# Patient Record
Sex: Female | Born: 1965 | Race: White | Hispanic: No | Marital: Married
Health system: Midwestern US, Academic
[De-identification: ages and names within clinical notes are randomized; demographics above are authoritative.]

---

## 2016-06-30 MED ORDER — LIDOCAINE (PF) 10 MG/ML (1 %) IJ SOLN
0 refills | Status: DC
Start: 2016-06-30 — End: 2016-06-30

## 2016-06-30 MED ORDER — LIDOCAINE (PF) 200 MG/10 ML (2 %) IJ SYRG
0 refills | Status: DC
Start: 2016-06-30 — End: 2016-06-30

## 2016-06-30 MED ORDER — ONDANSETRON HCL (PF) 4 MG/2 ML IJ SOLN
INTRAVENOUS | 0 refills | Status: DC
Start: 2016-06-30 — End: 2016-06-30

## 2016-06-30 MED ORDER — CEFAZOLIN 1 GRAM IJ SOLR
0 refills | Status: DC
Start: 2016-06-30 — End: 2016-06-30

## 2016-06-30 MED ORDER — PROPOFOL INJ 10 MG/ML IV VIAL
0 refills | Status: DC
Start: 2016-06-30 — End: 2016-06-30

## 2016-06-30 MED ORDER — FENTANYL CITRATE (PF) 50 MCG/ML IJ SOLN
0 refills | Status: DC
Start: 2016-06-30 — End: 2016-06-30

## 2016-06-30 MED ORDER — PHENYLEPHRINE IN 0.9% NACL(PF) 1 MG/10 ML (100 MCG/ML) IV SYRG
INTRAVENOUS | 0 refills | Status: DC
Start: 2016-06-30 — End: 2016-06-30

## 2016-06-30 MED ORDER — MIDAZOLAM 1 MG/ML IJ SOLN
INTRAVENOUS | 0 refills | Status: DC
Start: 2016-06-30 — End: 2016-06-30

## 2016-06-30 MED ORDER — ROPIVACAINE (PF) 5 MG/ML (0.5 %) IJ SOLN
0 refills | Status: DC
Start: 2016-06-30 — End: 2016-06-30

## 2016-07-08 MED ORDER — CEPHALEXIN 500 MG PO CAP
500 mg | ORAL_CAPSULE | Freq: Four times a day (QID) | ORAL | 1 refills | Status: DC
Start: 2016-07-08 — End: 2016-11-02

## 2016-10-28 ENCOUNTER — Encounter: Admit: 2016-10-28 | Discharge: 2016-10-28 | Payer: BC Managed Care – PPO

## 2016-10-28 ENCOUNTER — Ambulatory Visit: Admit: 2016-10-28 | Discharge: 2016-10-28 | Payer: BC Managed Care – PPO

## 2016-10-28 DIAGNOSIS — M1990 Unspecified osteoarthritis, unspecified site: Secondary | ICD-10-CM

## 2016-10-28 DIAGNOSIS — E785 Hyperlipidemia, unspecified: ICD-10-CM

## 2016-10-28 DIAGNOSIS — S91002D Unspecified open wound, left ankle, subsequent encounter: Principal | ICD-10-CM

## 2016-10-28 DIAGNOSIS — S91002S Unspecified open wound, left ankle, sequela: Principal | ICD-10-CM

## 2016-10-28 DIAGNOSIS — I10 Essential (primary) hypertension: ICD-10-CM

## 2016-10-28 DIAGNOSIS — F99 Mental disorder, not otherwise specified: ICD-10-CM

## 2016-10-28 DIAGNOSIS — E119 Type 2 diabetes mellitus without complications: ICD-10-CM

## 2016-10-28 DIAGNOSIS — E079 Disorder of thyroid, unspecified: ICD-10-CM

## 2016-10-28 DIAGNOSIS — K929 Disease of digestive system, unspecified: ICD-10-CM

## 2016-10-28 NOTE — Progress Notes
She is here today for reevaluation of her wound of her left ankle s/p peroneal repair and Brostrom.  She had a bit of postoperative hematoma that was expressed, but her wound was doing okay.  She has evidently been doing wound care locally.  I haven't even seen her back.  She still has this wound.  She talks about how they could see suture in the base of this.  She is not on any antibiotics.  We have taken a deep culture.  It probes down a fair bit.  I don't think there's any way in the world this thing is going to heal the way that it is.  Her wound needs to be washed out.  She will probably need to go into a VAC to let this thing granulate in.  Interestingly, though, the mechanical pain that she was having before and the severe instability have largely resolved.  I think that part is encouraging.  This needs to be done in the near future.      Surgery:  Left I&D and wound VAC placement        In the presence of Ree ShayGreg Sandrea Boer, MD,  I have taken down these notes, Danielle RankinStacy Grove, Safeco CorporationScribe. 10/28/2016 1:10 PM

## 2016-10-31 ENCOUNTER — Encounter: Admit: 2016-10-31 | Discharge: 2016-10-31 | Payer: BC Managed Care – PPO

## 2016-10-31 ENCOUNTER — Inpatient Hospital Stay: Admit: 2016-10-31 | Discharge: 2016-10-31 | Payer: BC Managed Care – PPO

## 2016-10-31 ENCOUNTER — Encounter: Admit: 2016-10-31 | Discharge: 2016-11-01 | Payer: BC Managed Care – PPO

## 2016-10-31 ENCOUNTER — Ambulatory Visit: Admit: 2016-10-31 | Discharge: 2016-10-31 | Payer: BC Managed Care – PPO

## 2016-10-31 ENCOUNTER — Ambulatory Visit: Admit: 2016-10-30 | Discharge: 2016-10-30 | Payer: BC Managed Care – PPO

## 2016-10-31 ENCOUNTER — Inpatient Hospital Stay
Admit: 2016-10-31 | Discharge: 2016-11-03 | Disposition: A | Discharge status: Home / Self Care | Payer: BC Managed Care – PPO

## 2016-10-31 DIAGNOSIS — S91002D Unspecified open wound, left ankle, subsequent encounter: ICD-10-CM

## 2016-10-31 DIAGNOSIS — I10 Essential (primary) hypertension: ICD-10-CM

## 2016-10-31 DIAGNOSIS — E785 Hyperlipidemia, unspecified: ICD-10-CM

## 2016-10-31 DIAGNOSIS — F99 Mental disorder, not otherwise specified: ICD-10-CM

## 2016-10-31 DIAGNOSIS — T814XXA Infection following a procedure, initial encounter: Principal | ICD-10-CM

## 2016-10-31 DIAGNOSIS — M1990 Unspecified osteoarthritis, unspecified site: Principal | ICD-10-CM

## 2016-10-31 DIAGNOSIS — S91002A Unspecified open wound, left ankle, initial encounter: Principal | ICD-10-CM

## 2016-10-31 DIAGNOSIS — E119 Type 2 diabetes mellitus without complications: ICD-10-CM

## 2016-10-31 DIAGNOSIS — E079 Disorder of thyroid, unspecified: ICD-10-CM

## 2016-10-31 DIAGNOSIS — K929 Disease of digestive system, unspecified: ICD-10-CM

## 2016-10-31 LAB — GRAM STAIN

## 2016-10-31 LAB — CULTURE-WOUND/TISSUE/FLUID(AEROBIC ONLY)W/SENSITIVITY

## 2016-10-31 LAB — POC GLUCOSE
Lab: 113 mg/dL — ABNORMAL HIGH (ref 70–100)
Lab: 94 mg/dL (ref 70–100)

## 2016-10-31 MED ORDER — ONDANSETRON HCL (PF) 4 MG/2 ML IJ SOLN
INTRAVENOUS | 0 refills | Status: DC
Start: 2016-10-31 — End: 2016-10-31
  Administered 2016-10-31: 18:00:00 4 mg via INTRAVENOUS

## 2016-10-31 MED ORDER — BISACODYL 10 MG RE SUPP
10 mg | Freq: Every day | RECTAL | 0 refills | Status: DC | PRN
Start: 2016-10-31 — End: 2016-11-03

## 2016-10-31 MED ORDER — LORATADINE 10 MG PO TAB
10 mg | Freq: Every evening | ORAL | 0 refills | Status: DC
Start: 2016-10-31 — End: 2016-10-31

## 2016-10-31 MED ORDER — PHENYLEPHRINE IN 0.9% NACL(PF) 1 MG/10 ML (100 MCG/ML) IV SYRG
INTRAVENOUS | 0 refills | Status: DC
Start: 2016-10-31 — End: 2016-10-31
  Administered 2016-10-31 (×10): 100 ug via INTRAVENOUS

## 2016-10-31 MED ORDER — VANCOMYCIN 1,000 MG IV SOLR
0 refills | Status: DC
Start: 2016-10-31 — End: 2016-10-31
  Administered 2016-10-31: 18:00:00 1 g via TOPICAL

## 2016-10-31 MED ORDER — INSULIN ASPART 100 UNIT/ML SC FLEXPEN
0-7 [IU] | Freq: Every day | SUBCUTANEOUS | 0 refills | Status: DC
Start: 2016-10-31 — End: 2016-11-03

## 2016-10-31 MED ORDER — FENTANYL CITRATE (PF) 50 MCG/ML IJ SOLN
50 ug | INTRAVENOUS | 0 refills | Status: DC | PRN
Start: 2016-10-31 — End: 2016-10-31
  Administered 2016-10-31: 19:00:00 50 ug via INTRAVENOUS

## 2016-10-31 MED ORDER — DOCUSATE SODIUM 100 MG PO CAP
100 mg | Freq: Two times a day (BID) | ORAL | 0 refills | Status: DC
Start: 2016-10-31 — End: 2016-11-03
  Administered 2016-11-01 – 2016-11-03 (×4): 100 mg via ORAL

## 2016-10-31 MED ORDER — FENTANYL CITRATE (PF) 50 MCG/ML IJ SOLN
25-50 ug | INTRAVENOUS | 0 refills | Status: DC | PRN
Start: 2016-10-31 — End: 2016-11-03
  Administered 2016-11-01: 08:00:00 50 ug via INTRAVENOUS
  Administered 2016-11-02: 20:00:00 25 ug via INTRAVENOUS
  Administered 2016-11-03: 08:00:00 50 ug via INTRAVENOUS

## 2016-10-31 MED ORDER — PROMETHAZINE 25 MG/ML IJ SOLN
6.25 mg | INTRAVENOUS | 0 refills | Status: DC | PRN
Start: 2016-10-31 — End: 2016-10-31

## 2016-10-31 MED ORDER — FLUTICASONE 50 MCG/ACTUATION NA SPSN
1 | Freq: Two times a day (BID) | NASAL | 0 refills | Status: DC | PRN
Start: 2016-10-31 — End: 2016-11-03

## 2016-10-31 MED ORDER — LIDOCAINE (PF) 10 MG/ML (1 %) IJ SOLN
.1-2 mL | INTRAMUSCULAR | 0 refills | Status: DC | PRN
Start: 2016-10-31 — End: 2016-10-31

## 2016-10-31 MED ORDER — OXYCODONE 5 MG PO TAB
5-10 mg | ORAL | 0 refills | Status: DC | PRN
Start: 2016-10-31 — End: 2016-11-01
  Administered 2016-11-01 (×5): 10 mg via ORAL

## 2016-10-31 MED ORDER — MIDAZOLAM 1 MG/ML IJ SOLN
INTRAVENOUS | 0 refills | Status: DC
Start: 2016-10-31 — End: 2016-10-31
  Administered 2016-10-31: 17:00:00 2 mg via INTRAVENOUS

## 2016-10-31 MED ORDER — SUCCINYLCHOLINE CHLORIDE 20 MG/ML IJ SOLN
INTRAVENOUS | 0 refills | Status: DC
Start: 2016-10-31 — End: 2016-10-31
  Administered 2016-10-31: 17:00:00 140 mg via INTRAVENOUS

## 2016-10-31 MED ORDER — ACETAMINOPHEN 650 MG RE SUPP
650 mg | RECTAL | 0 refills | Status: DC | PRN
Start: 2016-10-31 — End: 2016-11-03

## 2016-10-31 MED ORDER — MAGNESIUM HYDROXIDE 2,400 MG/10 ML PO SUSP
10 mL | Freq: Every day | ORAL | 0 refills | Status: DC
Start: 2016-10-31 — End: 2016-11-03
  Administered 2016-11-01 – 2016-11-03 (×3): 10 mL via ORAL

## 2016-10-31 MED ORDER — MULTIVITAMIN PO TAB
1 | Freq: Every day | ORAL | 0 refills | Status: DC
Start: 2016-10-31 — End: 2016-11-03
  Administered 2016-11-01 – 2016-11-03 (×4): 1 via ORAL

## 2016-10-31 MED ORDER — LACTATED RINGERS IV SOLP
1000 mL | INTRAVENOUS | 0 refills | Status: DC
Start: 2016-10-31 — End: 2016-11-01
  Administered 2016-10-31: 16:00:00 1000 mL via INTRAVENOUS
  Administered 2016-10-31: 18:00:00 1000.000 mL via INTRAVENOUS

## 2016-10-31 MED ORDER — LIDOCAINE (PF) 200 MG/10 ML (2 %) IJ SYRG
0 refills | Status: DC
Start: 2016-10-31 — End: 2016-10-31
  Administered 2016-10-31: 17:00:00 120 mg via INTRAVENOUS

## 2016-10-31 MED ORDER — ASPIRIN 81 MG PO TBEC
81 mg | Freq: Every evening | ORAL | 0 refills | Status: DC
Start: 2016-10-31 — End: 2016-11-03
  Administered 2016-11-01 – 2016-11-03 (×2): 81 mg via ORAL

## 2016-10-31 MED ORDER — METFORMIN 500 MG PO TAB
500 mg | Freq: Two times a day (BID) | ORAL | 0 refills | Status: DC
Start: 2016-10-31 — End: 2016-11-03
  Administered 2016-11-01 – 2016-11-03 (×6): 500 mg via ORAL

## 2016-10-31 MED ORDER — ESTRADIOL 1 MG PO TAB
1 mg | Freq: Every evening | ORAL | 0 refills | Status: DC
Start: 2016-10-31 — End: 2016-11-03
  Administered 2016-11-01 – 2016-11-03 (×3): 1 mg via ORAL

## 2016-10-31 MED ORDER — VENLAFAXINE 75 MG PO CP24
75 mg | Freq: Every day | ORAL | 0 refills | Status: DC
Start: 2016-10-31 — End: 2016-11-03
  Administered 2016-11-01 – 2016-11-03 (×3): 75 mg via ORAL

## 2016-10-31 MED ORDER — IMS MIXTURE TEMPLATE
125 ug | Freq: Every day | ORAL | 0 refills | Status: DC
Start: 2016-10-31 — End: 2016-11-03
  Administered 2016-11-01 – 2016-11-03 (×4): 125 ug via ORAL

## 2016-10-31 MED ORDER — CEFAZOLIN 1 GRAM IJ SOLR
0 refills | Status: DC
Start: 2016-10-31 — End: 2016-10-31
  Administered 2016-10-31: 17:00:00 3 g via INTRAVENOUS

## 2016-10-31 MED ORDER — OXYCODONE 5 MG PO TAB
5-10 mg | Freq: Once | ORAL | 0 refills | Status: CP | PRN
Start: 2016-10-31 — End: ?
  Administered 2016-10-31: 20:00:00 10 mg via ORAL

## 2016-10-31 MED ORDER — CEFAZOLIN INJ 1GM IVP
2 g | INTRAVENOUS | 0 refills | Status: DC
Start: 2016-10-31 — End: 2016-11-03
  Administered 2016-11-01 – 2016-11-03 (×8): 2 g via INTRAVENOUS

## 2016-10-31 MED ORDER — PANTOPRAZOLE 40 MG PO TBEC
40 mg | Freq: Two times a day (BID) | ORAL | 0 refills | Status: DC
Start: 2016-10-31 — End: 2016-11-03
  Administered 2016-11-01 – 2016-11-03 (×6): 40 mg via ORAL

## 2016-10-31 MED ORDER — PROPOFOL INJ 10 MG/ML IV VIAL
0 refills | Status: DC
Start: 2016-10-31 — End: 2016-10-31
  Administered 2016-10-31: 17:00:00 200 mg via INTRAVENOUS
  Administered 2016-10-31: 17:00:00 20 mg via INTRAVENOUS
  Administered 2016-10-31: 17:00:00 150 mg via INTRAVENOUS
  Administered 2016-10-31: 17:00:00 30 mg via INTRAVENOUS

## 2016-10-31 MED ORDER — FENTANYL CITRATE (PF) 50 MCG/ML IJ SOLN
0 refills | Status: DC
Start: 2016-10-31 — End: 2016-10-31
  Administered 2016-10-31: 17:00:00 50 ug via INTRAVENOUS
  Administered 2016-10-31: 17:00:00 100 ug via INTRAVENOUS

## 2016-10-31 MED ORDER — SODIUM CHLORIDE 0.9 % IV SOLP
INTRAVENOUS | 0 refills | Status: DC
Start: 2016-10-31 — End: 2016-11-01
  Administered 2016-10-31: 21:00:00 1000 mL via INTRAVENOUS

## 2016-10-31 MED ORDER — LORATADINE 10 MG PO TAB
10 mg | Freq: Every day | ORAL | 0 refills | Status: DC
Start: 2016-10-31 — End: 2016-11-03
  Administered 2016-11-01 – 2016-11-03 (×3): 10 mg via ORAL

## 2016-10-31 MED ORDER — ACETAMINOPHEN 325 MG PO TAB
650 mg | ORAL | 0 refills | Status: DC | PRN
Start: 2016-10-31 — End: 2016-11-03
  Administered 2016-11-01: 08:00:00 650 mg via ORAL

## 2016-10-31 MED ORDER — DEXTRAN 70-HYPROMELLOSE (PF) 0.1-0.3 % OP DPET
0 refills | Status: DC
Start: 2016-10-31 — End: 2016-10-31
  Administered 2016-10-31: 17:00:00 2 [drp] via OPHTHALMIC

## 2016-10-31 MED ORDER — FENTANYL CITRATE (PF) 50 MCG/ML IJ SOLN
25 ug | INTRAVENOUS | 0 refills | Status: DC | PRN
Start: 2016-10-31 — End: 2016-10-31
  Administered 2016-10-31: 19:00:00 25 ug via INTRAVENOUS

## 2016-10-31 MED ORDER — ROPIVACAINE (PF) 5 MG/ML (0.5 %) IJ SOLN
0 refills | Status: DC
Start: 2016-10-31 — End: 2016-10-31
  Administered 2016-10-31: 19:00:00 30 mL

## 2016-10-31 MED ORDER — ONDANSETRON HCL (PF) 4 MG/2 ML IJ SOLN
4 mg | INTRAVENOUS | 0 refills | Status: DC | PRN
Start: 2016-10-31 — End: 2016-11-03

## 2016-10-31 MED ORDER — DIPHENHYDRAMINE HCL 50 MG/ML IJ SOLN
25 mg | INTRAVENOUS | 0 refills | Status: DC | PRN
Start: 2016-10-31 — End: 2016-11-03

## 2016-10-31 MED ORDER — SITAGLIPTIN 100 MG PO TAB
100 mg | Freq: Every day | ORAL | 0 refills | Status: DC
Start: 2016-10-31 — End: 2016-11-03
  Administered 2016-11-01 – 2016-11-03 (×3): 100 mg via ORAL

## 2016-10-31 MED ORDER — ACETAMINOPHEN 500 MG PO TAB
1000 mg | Freq: Three times a day (TID) | ORAL | 0 refills | Status: DC
Start: 2016-10-31 — End: 2016-11-03
  Administered 2016-11-01 – 2016-11-02 (×4): 1000 mg via ORAL
  Administered 2016-11-02: 01:00:00 500 mg via ORAL
  Administered 2016-11-02 – 2016-11-03 (×2): 1000 mg via ORAL

## 2016-10-31 MED ORDER — ATORVASTATIN 20 MG PO TAB
20 mg | Freq: Every evening | ORAL | 0 refills | Status: DC
Start: 2016-10-31 — End: 2016-11-03
  Administered 2016-11-01 – 2016-11-03 (×3): 20 mg via ORAL

## 2016-10-31 MED ORDER — DIPHENHYDRAMINE HCL 25 MG PO CAP
25 mg | ORAL | 0 refills | Status: DC | PRN
Start: 2016-10-31 — End: 2016-11-03

## 2016-10-31 MED ADMIN — FENTANYL CITRATE (PF) 50 MCG/ML IJ SOLN [3037]: 50 ug | INTRAVENOUS | Stop: 2016-10-31 | NDC 00409909412

## 2016-10-31 NOTE — H&P (View-Only)
..  [facility] History & Physical Note      Admission Date: 10/31/2016                                                  Chief Complaint:  Left ankle wound    Assessment/Plan    debride left ankle wound, wound vac  ______________________________________________________________________    History of Present Illness: Joyce Mcdowell is a 51 y.o. female.  S/p ankle stabilization.  Had a post operative hematoma.  Has had a persistent wound with some drainage.  Here for I&D    Past Medical History:   Diagnosis Date   ??? Arthritis    ??? DM (diabetes mellitus) (HCC)    ??? Gastrointestinal disorder     gerd   ??? Hyperlipidemia    ??? Hypertension    ??? Osteoarthritis    ??? Psychiatric illness     situational depression   ??? Thyroid disease        Past Surgical History:   Procedure Laterality Date   ??? CRUCIATE AND COLLATERAL LIGAMENT REPAIR Left 06/30/2016    RECONSTRUCTION LIGAMENT COLLATERAL LOWER EXTREMITY performed by Ree Shay, MD at [facility]   ??? LEG TENDON SURGERY Left 06/30/2016    SECONDARY PERINEAL TENDON REPAIR performed by Ree Shay, MD at [facility]   ??? ABDOMEN SURGERY      ruptured absess that was attached to ovary   ??? FOOT NEUROMA SURGERY Left    ??? HX APPENDECTOMY     ??? HX ARTHROSCOPIC SURGERY Bilateral     4 total   ??? HX CESAREAN SECTION     ??? HX CHOLECYSTECTOMY     ??? HX HYSTERECTOMY      with absess surgery   ??? HX WRIST FRACTURE SURGERY     ??? KNEE SURGERY Right    ??? SHOULDER SURGERY Bilateral     r- cleaned out and impingement, r-tendon repair, l cleaned out and impengement       Social History      Family history  Family history is otherwise noncontributory for presenting problem    Allergies:  Adhesive tape (rosins)    Outpatient Prescriptions as of 10/31/2016   Medication Sig Dispense Refill   ??? acetaminophen (TYLENOL) 500 mg tablet Take 1,000 mg by mouth three times daily. Max of 4,000 mg of acetaminophen in 24 hours.     ??? aspirin EC 81 mg tablet Take 81 mg by mouth at bedtime daily. Take with food.     ??? atorvastatin (LIPITOR) 20 mg tablet TAKE 1 TABLET BY MOUTH DAILY AT BEDTIME  0   ??? cephalexin (KEFLEX) 500 mg capsule Take 1 capsule by mouth four times daily. 20 capsule 1   ??? estradiol (ESTRACE) 1 mg tablet Take 1 mg by mouth at bedtime daily.  0   ??? FARXIGA 10 mg tab TAKE 1 TABLET BY MOUTH DAILY  3   ??? fexofenadine(+) (ALLEGRA) 180 mg tablet Take 180 mg by mouth daily.     ??? fluticasone (FLONASE) 50 mcg/actuation nasal spray Apply 1 spray to each nostril as directed twice daily as needed. Shake bottle gently before using.     ??? HYDROcodone/acetaminophen(+) (NORCO) 10/325 mg tablet Take 1 tablet by mouth every 6 hours as needed for Pain 0.5 tab as needed     ???  JANUVIA 100 mg tab tablet TAKE 1 TABLET BY MOUTH DAILY  2   ??? levothyroxine (SYNTHROID) 125 mcg tablet TAKE 1 TABLET BY MOUTH DAILY  3   ??? lisinopril (PRINIVIL; ZESTRIL) 20 mg tablet TAKE 1 TABLET BY MOUTH EVERY NIGHT  4   ??? loratadine (CLARITIN) 10 mg tablet Take 10 mg by mouth at bedtime daily.     ??? metFORMIN (GLUCOPHAGE) 500 mg tablet TAKE 1 TABLET BY MOUTH TWICE DAILY  5   ??? other medication Take 1 Dose by mouth. Medication Name & Strength: Caniboid Oil  Dose(how many): Unknown Frequency(how often): Twice Daily Sublingual     ??? pantoprazole DR (PROTONIX) 40 mg tablet TAKE 1 TABLET BY MOUTH TWICE DAILY  2   ??? venlafaxine XR (EFFEXOR XR) 75 mg capsule Take 75 mg by mouth daily.  2   ??? vitamins, multiple tablet Take 1 tablet by mouth daily.           Review of Systems:    Otherwise a 10 point review of systems was negative      Review of Systems:  Musculoskeletal:positive for bone pain    Vital Signs:  Last Filed in 24 hours   BP: 153/79 (08/03 1054)  Temp: 36.9 ???C (98.4 ???F) (08/03 1054)  Pulse: 100 (08/03 1054)  Respirations: 13 PER MINUTE (08/03 1054)  SpO2: 95 % (08/03 1054)  O2 Delivery: None (Room Air) (08/03 1054)  Height: 180.3 cm (71) (08/03 1054)     Physical Exam:    General: AAOx3, NAD  Psych: Mood/Affect appropriate ENT: Oropharynx/Nasopharynx clear  Respiratory: Unlabored respirations  Cardio: Regular  GI: soft  Vascular:  Skin:Wound over lateral ankle with serous drainage  Musculoskeletal: ankle stable  Neurologic: grossly intact    Lab/Radiology/Other Diagnostic Tests:    Radiographs:     CBC w/Diff   Lab Results   Component Value Date/Time    WBC 9.1 06/20/2016 02:32 PM    HGB 13.8 06/20/2016 02:32 PM    HCT 40.7 06/20/2016 02:32 PM    PLTCT 231 06/20/2016 02:32 PM           Basic Metabolic Profile   Lab Results   Component Value Date/Time    NA 138 06/20/2016 02:32 PM    K 4.1 06/20/2016 02:32 PM    CL 105 06/20/2016 02:32 PM    CO2 25 06/20/2016 02:32 PM    GAP 8 06/20/2016 02:32 PM    BUN 24 06/20/2016 02:32 PM    CR 0.74 06/20/2016 02:32 PM    GLU 127 (H) 06/20/2016 02:32 PM        Coagulation Studies   No results found for: PT, PTT, INR       Ree Shay, MD

## 2016-10-31 NOTE — Progress Notes
Patient arrived on unit via wheelchair accompanied by transport. Patient transferred to the bed without assistance. Assessment completed, refer to flowsheet for details. Orders released, reviewed, and implemented as appropriate. Oriented to surroundings, call light within reach. Plan of care reviewed.  Will continue to monitor and assess.

## 2016-10-31 NOTE — Progress Notes
RESPIRATORY THERAPY  ADULT PROTOCOL EVALUATION      RESPIRATORY PROTOCOL PLAN    Medications       Note: If indicated by protocol, medication orders will be placed by therapist.    Procedures          _____________________________________________________________    PATIENT EVALUATION RESULTS    Chart Review  * Pulmonary Hx: No pulmonary diagnosis OR no smoking hx    * Surgical Hx: General surgery (cough & sigh not affected)    * Chest X-Ray: Clear OR not available    * PFT/Oxygenation: FEV1, PEFR 70-80% OR Pa02 70-80 RA OR Sp02 92-95%      Patient Assessment  * Respiratory Pattern: Regular pattern and rate OR good chest excursion with deep breathing    * Breath Sounds: Clear and able to auscultate bases posteriorly    * Cough / Sputum: Strong, effective cough OR nonproductive    * Mental Status: Alert, oriented, cooperative    * Activity Level: Ambulatory with assistance      Priority Index  Total Points: 3 Points  * Priority Index: Criteria not met    PRIORITY INDEX GUIDELINES*  Priority Points   1 0-9 points   2 9-18 points   3 > 18 points   + Pulm Dx or Home Rx   *Higher points indicate higher acuity.      Therapist: Rosezella [place], RT  Date: 10/31/2016      Key  AC = Airway clearance  AM = Aerosolized medication  BA = Bland aerosol  DB&C = Deep breathe & cough  FEV1 = Forced expiratory volume in first second)  IC = Inspiratory capacity  LE = Lung expansion  MDI = Metered dose inhaler  Neb = Nebulizer  O2 = Oxygen  Oxim =Oximetry  PEFR = Peak expiratory flow rate  RRT = Rapid Response Team

## 2016-10-31 NOTE — Other
Brief Operative Note    Name: Joyce Mcdowell is a 51 y.o. female     DOB: 11-12-1965             MRN#: [id number]  DATE OF OPERATION: 10/31/2016    Date:  10/31/2016        Preoperative Dx:   Ankle wound, left, subsequent encounter [S91.002D]    Post-op Diagnosis      * Ankle wound, left, subsequent encounter [S91.002D]    Procedure(s) (LRB):  INCISION AND DRAINAGE OF LEFT ANKLE. (Left)  WOUND VAC PALCEMENT LEFT ANKLE (Left)  REMOVAL HARDWARE -DEEP (Left)    Anesthesia Type: Defer to Anesthesia    Surgeon(s) and Role:     * Ree ShayHorton, Jaliya Siegmann, MD - Primary      Findings:  NO GROSS PURULENCE.  RETAINED SUTURES AND ANCHORS REMOVED AND SENT FOR CULTURE    Estimated Blood Loss: No blood loss documented.     Specimen(s) Removed/Disposition:   ID Type Source Tests Collected by Time Destination   A : LEFT ANKLE TISSUE Tissue Ankle CULTURE-ANAEROBIC, CULTURE-WOUND/TISSUE/FLUID(AEROBIC ONLY)W/SENSITIVITY, CULTURE-TB (AFB), Ardis RowanGRAM STAIN, CULTURE-FUNGAL,OTHER Ree ShayHorton, Tyra Gural, MD 10/31/2016 1232        Complications:  None    Implants: None    Drains: Other WOUND VAC    Disposition:  PACU - stable    Ree ShayGreg Meiah Zamudio, MD  Pager [phone]

## 2016-10-31 NOTE — Anesthesia Pre-Procedure Evaluation
Anesthesia Pre-Procedure Evaluation    Name: Joyce Mcdowell      MRN: [id number]     DOB: May 16, 1965     Age: 50 y.o.     Sex: female   __________________________________________________________________________     Procedure Date: 10/31/2016 06/30/16  Procedure: Procedure(s) with comments:  INCISION AND DRAINAGE OF LEFT ANKLE WITH WOUND VAC PLACEMENT - CASE LENGTH 1 HOUR  WOUND VAC PALCEMENT LEFT ANKLE Left lateral ankle ligament reconstruction, secondary peroneal tendon repair, possible allograft, possible [facility] transfer     Physical Assessment  Vital Signs (last filed in past 24 hours):  BP: 153/79 (08/03 1054)  Temp: 36.9 ???C (98.4 ???F) (08/03 1054)  Pulse: 100 (08/03 1054)  Respirations: 13 PER MINUTE (08/03 1054)  SpO2: 95 % (08/03 1054)  O2 Delivery: None (Room Air) (08/03 1054)  Height: 180.3 cm (71) (08/03 1054)  Weight: 143.4 kg (316 lb 2.2 oz) (08/03 1054)      Patient History  Allergies   Allergen Reactions   ??? Adhesive Tape (Rosins) BLISTERS        Current Medications    Medication Directions   acetaminophen (TYLENOL) 500 mg tablet Take 1,000 mg by mouth three times daily. Max of 4,000 mg of acetaminophen in 24 hours.   aspirin EC 81 mg tablet Take 81 mg by mouth at bedtime daily. Take with food.   atorvastatin (LIPITOR) 20 mg tablet TAKE 1 TABLET BY MOUTH DAILY AT BEDTIME   cephalexin (KEFLEX) 500 mg capsule Take 1 capsule by mouth four times daily.   estradiol (ESTRACE) 1 mg tablet Take 1 mg by mouth at bedtime daily.   FARXIGA 10 mg tab TAKE 1 TABLET BY MOUTH DAILY   fexofenadine(+) (ALLEGRA) 180 mg tablet Take 180 mg by mouth daily.   fluticasone (FLONASE) 50 mcg/actuation nasal spray Apply 1 spray to each nostril as directed twice daily as needed. Shake bottle gently before using.   HYDROcodone/acetaminophen(+) (NORCO) 10/325 mg tablet Take 1 tablet by mouth every 6 hours as needed for Pain 0.5 tab as needed   JANUVIA 100 mg tab tablet TAKE 1 TABLET BY MOUTH DAILY levothyroxine (SYNTHROID) 125 mcg tablet TAKE 1 TABLET BY MOUTH DAILY   lisinopril (PRINIVIL; ZESTRIL) 20 mg tablet TAKE 1 TABLET BY MOUTH EVERY NIGHT   loratadine (CLARITIN) 10 mg tablet Take 10 mg by mouth at bedtime daily.   metFORMIN (GLUCOPHAGE) 500 mg tablet TAKE 1 TABLET BY MOUTH TWICE DAILY   other medication Take 1 Dose by mouth. Medication Name & Strength: Caniboid Oil  Dose(how many): Unknown Frequency(how often): Twice Daily Sublingual   pantoprazole DR (PROTONIX) 40 mg tablet TAKE 1 TABLET BY MOUTH TWICE DAILY   venlafaxine XR (EFFEXOR XR) 75 mg capsule Take 75 mg by mouth daily.   vitamins, multiple tablet Take 1 tablet by mouth daily.         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    Abdominal Findings:       Obese    Neurological Findings:       Normal mental status       Diagnostic Tests  Hematology:   Lab Results   Component Value Date    HGB 13.8 06/20/2016    HCT 40.7 06/20/2016    PLTCT 231 06/20/2016    WBC 9.1 06/20/2016    MCV 89.3 06/20/2016    MCH 30.1 06/20/2016    MCHC 33.7 06/20/2016    MPV 9.9 06/20/2016    RDW 14.3 06/20/2016         General Chemistry:   Lab Results   Component Value Date    NA 138 06/20/2016    K 4.1 06/20/2016    CL 105 06/20/2016    CO2 25 06/20/2016    GAP 8 06/20/2016    BUN 24 06/20/2016    CR 0.74 06/20/2016    GLU 127 06/20/2016    CA 9.5 06/20/2016      Coagulation: No results found for: PT, PTT, INR      Anesthesia Plan    ASA score: 2   Plan: general and regional for postoperative pain  Induction method: intravenous  NPO status: acceptable      Informed Consent  Anesthetic plan and risks discussed with patient.  Use of blood products discussed with patient; consented to blood products.      Plan discussed with: anesthesiologist.     Labs: cbc, bmp  Consults: None

## 2016-10-31 NOTE — Anesthesia Post-Procedure Evaluation
Post-Anesthesia Evaluation    Name: Joyce Mcdowell      MRN: [id number]     DOB: 1965-06-07     Age: 51 y.o.     Sex: female   __________________________________________________________________________     Procedure Date: 10/31/2016  Procedure: Procedure(s) with comments:  INCISION AND DRAINAGE OF LEFT ANKLE. - CASE LENGTH 1 HOUR  WOUND VAC PALCEMENT LEFT ANKLE  REMOVAL HARDWARE -DEEP      Surgeon: Surgeon(s):  Ree ShayHorton, Greg, MD    Post-Anesthesia Vitals  BP: 149/76 (08/03 1445)  Pulse: 107 (08/03 1445)  Respirations: 16 PER MINUTE (08/03 1445)  SpO2: 93 % (08/03 1445)  O2 Delivery: None (Room Air) (08/03 1445)  SpO2 Pulse: 107 (08/03 1445)      Post Anesthesia Evaluation Note    Evaluation location: Pre/Post  Patient participation: recovered; patient participated in evaluation  Level of consciousness: alert    Pain score: 2  Pain management: adequate    Hydration: normovolemia  Temperature: 36.0C - 38.4C  Airway patency: adequate    Regional/Neuraxial:       Neurological status: sensory deficit      Single injection shot performed    Perioperative Events  Perioperative events:  no       Post-op nausea and vomiting: no PONV    Postoperative Status  Cardiovascular status: hemodynamically stable  Respiratory status: spontaneous ventilation  Follow-up needed: none        Perioperative Events  Perioperative Event: No  Emergency Case Activation: No

## 2016-10-31 NOTE — Anesthesia Procedure Notes
Anesthesia Procedure: Peripheral Nerve Block    PERIPHERAL NERVE BLOCK  Date/Time: 10/31/2016 2:00 PM    Patient location: post-op  Reason for block: at surgeon's request and post-op pain management    Preprocedure checklist performed: 2 patient identifiers, risks & benefits discussed, patient evaluated, timeout performed, consent obtained and patient being monitored    Sterile technique:  - Proper hand washing  - Cap, mask  - Sterile gloves  - Skin prep for antisepsis        Peripheral Nerve Block Procedure   Patient position: supine  Prep: ChloraPrep    Monitoring: BP, EKG and continuous pulse ox  Block type: popliteal  Laterality: left  Injection technique: single-shot  Procedures: ultrasound guided    Local infiltration: lidocaine    Needle/cathether:      Needle type: Stimuplex      Needle gauge: 22 G; Needle length: 4 in     Needle location: anatomical landmarks and ultrasound guidance    Procedure Outcome   Injection assessment: negative aspiration for heme, no paresthesia on injection, incremental injection and local visualized surrounding nerve on ultrasound  Observations: adequate block, patient tolerated the procedure well with no immediate complications, comfortable throughout block and no sedation      Refer to nursing documentation for vitals and monitoring data during procedure.    Performed by: Rebbeca PaulMCKENZIE, Jadon Harbaugh  Authorized by: Robbi GarterMCCARTNEY, MICHAEL

## 2016-11-01 LAB — CBC: Lab: 10 10*3/uL — ABNORMAL LOW (ref 4.5–11.0)

## 2016-11-01 LAB — POC GLUCOSE
Lab: 98 mg/dL (ref 70–100)
Lab: 103 mg/dL — ABNORMAL HIGH (ref 70–100)
Lab: 124 mg/dL — ABNORMAL HIGH (ref 70–100)
Lab: 103 mg/dL — ABNORMAL HIGH (ref 70–100)
Lab: 100 mg/dL (ref 70–100)

## 2016-11-01 LAB — BASIC METABOLIC PANEL: Lab: 140 MMOL/L — ABNORMAL HIGH (ref >60)

## 2016-11-01 MED ORDER — OXYCODONE 5 MG PO TAB
5-15 mg | ORAL | 0 refills | Status: DC | PRN
Start: 2016-11-01 — End: 2016-11-03
  Administered 2016-11-01: 21:00:00 5 mg via ORAL
  Administered 2016-11-01: 23:00:00 10 mg via ORAL
  Administered 2016-11-02: 11:00:00 5 mg via ORAL
  Administered 2016-11-02: 23:00:00 15 mg via ORAL
  Administered 2016-11-02: 03:00:00 10 mg via ORAL
  Administered 2016-11-02 (×2): 5 mg via ORAL
  Administered 2016-11-03 (×5): 15 mg via ORAL

## 2016-11-01 NOTE — Progress Notes
S: No acute events.  Pain well-controlled on current regimen.  Patient tolerating current diet.     O:  Blood pressure 134/71, pulse 88, temperature 36.8 C (98.3 F), height 180.3 cm (71"), weight (!) 143.4 kg (316 lb 2.2 oz), SpO2 96 %.   General: AAOx3, NAD  Cardiac: RRR  Respirations: Unlabored  Abdomen: soft, nt  Extremities: left lower extremity compartments soft, wiggles toes , + ankle pf df, SILT, distal cap refill < 2 sec  Incisions:  Clean dry and intact, wound vac in place and holding sx      Post-op Drains: (24 hours)     Wound VAC: 200ml.    No results for input(s): PTT, INR in the last 72 hours.    CBC w/Diff    Lab Results   Component Value Date/Time    WBC 10.2 11/01/2016 04:17 AM    HGB 12.2 11/01/2016 04:17 AM    HCT 36.2 11/01/2016 04:17 AM    PLTCT 179 11/01/2016 04:17 AM            Basic Metabolic Profile    Lab Results   Component Value Date/Time    NA 140 11/01/2016 04:17 AM    K 3.8 11/01/2016 04:17 AM    CL 105 11/01/2016 04:17 AM    CO2 29 11/01/2016 04:17 AM    GAP 6 11/01/2016 04:17 AM    Lab Results   Component Value Date/Time    BUN 17 11/01/2016 04:17 AM    CR 0.78 11/01/2016 04:17 AM    GLU 112 (H) 11/01/2016 04:17 AM        A/P: 38M s/p I&D L foot and wnd vac 8/3    -PT OT  -weight bearing: NWB LLE  -DVT ppx: asa, mobilize, scd  -continue current regimen for pain control  -acute blood loss anemia-Hgb 12.2  -ID. On ancef. Cltx: NGTD    Dispo: OR tomorrow. Posted. Consented. NPO@[place] .    Alden HippGrote  [id number]

## 2016-11-01 NOTE — Consults
Infectious Diseases Initial Consult    Today's Date:  11/01/2016  Admission Date: 10/31/2016    Reason for this consultation: wound infection  Consult Type: Co-Management w/Signed Orders    Ordering Provider's or Responsible Teams's Pager #? [phone]      Assessment:     Principal Problem:    Ankle wound, left, subsequent encounter  Active Problems:    Wound infection  Left ankle injury with lateral ligaments torn including complex tear of peroneus brevis  Surgical repair of ligaments  Postop hematoma and breakdown of skin with wound      Recommendations:     Antibiotic indications  Left ankle wound  Non healing post op due to hematoma formation  Left ankle lateral ligamentous repair in April   Apparently, all is soft tissue without any bone involvement  Synovial fluid was reportedly leaking when seen at the [facility] wound care clinic    Microbes isolated  07/22/2016 moderate Staphylococcus coagulase-negative,   no fungal growth, no anaerobic growth.  10/10/2016 left lateral ankle Corynebacterium species light growth,   no anaerobes  10/31/2016 left ankle,   Gram stain no organisms seen, few WBC.    Antibiotic selected  Cefazolin, 2 gm every 8 hrs    Start dates, projected duration, stop dates  Cefazolin start date 10/31/16    Monitor  CBC diff CMP ESR  Monitor cultures for growth/suscept    Other  We will want direction from Dr Wilkie Aye on the extent of the infection and tissue breakdown, specifically regarding the need for IV antibiotics and PICC line. If the synovial space was leaking, this makes it seem more likely that even though this is primarily soft tissue involvement, we may need to use IV for several weeks before moving to orals.  , MD      Antimicrobial Start date Projected duration & stop date End date for antibiotics stopped already                                             Estimated Creatinine Clearance: 132.9 mL/min (based on SCr of 0.78 mg/dL).    History of Present Illness Joyce Mcdowell is a 51 y.o.lady from [place], [place], who was seen the 20 2018 in the foot orthopedic clinic by Dr. Wilkie Aye.  She was coming for evaluation of the left ankle that been injured after he stepped in a ditch in a fall.  She had been seen previously, placed in a cam walker, but unable to get out of the Cam because of pain and swelling.  She had a history of injuries of the ankle as a child, has had problems with the knee.  An MRI showed the lateral ligaments were torn, she had a complex tear of the peroneus brevis.    On 06/30/2016, she underwent secondary repair of the left peroneus brevis tendon and lateral ankle ligament reconstruction using a modified Brostr???m technique.  She did have a hematoma postoperatively that was drained and seem to be resolving well.  Wound was recultured.  She was seen again on 10/28/2016, to need to have a wound in the area where the previous hematoma had been but was doing okay with local wound care.  The assessment was that the wound was not likely he will without being surgically repaired.    She was readmitted and had the following procedure on 10/31/2016:  Post-op Diagnosis   Ankle wound, left, subsequent encounter [S91.002D]  ???  Procedure  INCISION AND DRAINAGE OF LEFT ANKLE. (Left)  WOUND VAC PALCEMENT LEFT ANKLE (Left)  REMOVAL HARDWARE -DEEP (Left)    Cultures taken show the following: From 07/22/2016 there was moderate growth of Staphylococcus coagulase-negative, no fungal growth, no anaerobic growth.  Cultures taken from 10/10/2016 left lateral ankle Corynebacterium species light growth, no anaerobes  Cultures taken 10/31/2016 left ankle, Gram stain no organisms seen few WBC.  She was not on any antibiotics recently but has been on both doxycycline and keflex in recent past as well as nitrofurantoin for a UTI.    Past Medical History     Past Medical History:   Diagnosis Date   ??? Arthritis    ??? DM (diabetes mellitus) (HCC)    ??? Gastrointestinal disorder     gerd ??? Hyperlipidemia    ??? Hypertension    ??? Osteoarthritis    ??? Psychiatric illness     situational depression   ??? Thyroid disease        Past Surgical History     Past Surgical History:   Procedure Laterality Date   ??? CRUCIATE AND COLLATERAL LIGAMENT REPAIR Left 06/30/2016    RECONSTRUCTION LIGAMENT COLLATERAL LOWER EXTREMITY performed by Ree Shay, MD at [facility]   ??? LEG TENDON SURGERY Left 06/30/2016    SECONDARY PERINEAL TENDON REPAIR performed by Ree Shay, MD at [facility]   ??? ABDOMEN SURGERY      ruptured absess that was attached to ovary   ??? FOOT NEUROMA SURGERY Left    ??? HX APPENDECTOMY     ??? HX ARTHROSCOPIC SURGERY Bilateral     4 total   ??? HX CESAREAN SECTION     ??? HX CHOLECYSTECTOMY     ??? HX HYSTERECTOMY      with absess surgery   ??? HX WRIST FRACTURE SURGERY     ??? KNEE SURGERY Right    ??? SHOULDER SURGERY Bilateral     r- cleaned out and impingement, r-tendon repair, l cleaned out and impengement       Social History      Social History   Substance Use Topics   ??? Smoking status: Never Smoker   ??? Smokeless tobacco: Never Used   ??? Alcohol use 1.2 oz/week     2 Shots of liquor per week      Comment: occasional   Teaches school in HS    Family History   FH of DM, cancer and heart disease    Medications   Scheduled Meds:  acetaminophen (TYLENOL) tablet 1,000 mg 1,000 mg Oral TID   aspirin EC tablet 81 mg 81 mg Oral QHS   atorvastatin (LIPITOR) tablet 20 mg 20 mg Oral QHS   ceFAZolin (ANCEF) IVP 2 g 2 g Intravenous Q8H*   docusate (COLACE) capsule 100 mg 100 mg Oral BID(9-17)   estradiol (ESTRACE) tablet 1 mg 1 mg Oral QHS   insulin aspart U-100 (NOVOLOG FLEXPEN) injection PEN 0-7 Units 0-7 Units Subcutaneous 5 X Day   levothyroxine (SYNTHROID) tablet 125 mcg 125 mcg Oral QDAY   loratadine (CLARITIN) tablet 10 mg 10 mg Oral QDAY   metFORMIN (GLUCOPHAGE) tablet 500 mg 500 mg Oral BID   milk of magnesia (CONC) oral suspension 10 mL 10 mL Oral QDAY(21) pantoprazole DR (PROTONIX) tablet 40 mg 40 mg Oral BID   sitaGLIPtin (JANUVIA) tablet 100 mg 100 mg Oral QDAY   venlafaxine  XR (EFFEXOR XR) capsule 75 mg 75 mg Oral QDAY   vitamins, multiple tablet 1 tablet 1 tablet Oral QDAY   Continuous Infusions:  PRN and Respiratory Meds:acetaminophen Q4H PRN **OR** acetaminophen Q4H PRN, bisacodyl QDAY PRN, diphenhydrAMINE Q6H PRN **OR** diphenhydrAMINE Q6H PRN, fentaNYL citrate PF Q1H PRN, fluticasone BID PRN, ondansetron (ZOFRAN) IV Q6H PRN, oxyCODONE Q4H PRN      Allergies     Allergies   Allergen Reactions   ??? Adhesive Tape (Rosins) BLISTERS       Review of Systems     A comprehensive 14-point review of systems was negative with exception of those items listed above in the history. Others are noted below.  None except for those above  No fever, chills sweats, night sweats, nausea vomiting rash or diarrhea    Physical Examination                          Vital Signs: Last                  Vital Signs: 24 Hour Range   BP: 134/71 (08/04 0527)  Temp: 36.8 ???C (98.3 ???F) (08/04 0527)  Pulse: 88 (08/04 0527)  Respirations: 16 PER MINUTE (08/04 0527)  SpO2: 96 % (08/04 0527)  O2 Delivery: None (Room Air) (08/04 0527)  SpO2 Pulse: 105 (08/03 1600)  Height: 180.3 cm (71) (08/03 1054) BP: (128-155)/(71-81)   Temp:  [36.8 ???C (98.2 ???F)-37.2 ???C (98.9 ???F)]   Pulse:  [88-111]   Respirations:  [12 PER MINUTE-16 PER MINUTE]   SpO2:  [90 %-100 %]   O2 Delivery: None (Room Air)     General appearance: alert, oriented, NAD  HEENT: Conj nl, PERRL, EOM grossly intact,   mucus membranes moist, no oral lesions/thrush  Neck: supple, no lymphadenopathy, thyroid not palpable  Lungs: no wheezing, rhonchi, rales appreciated  Heart: Regular rhythm, reg rate, with no murmur   Abdomen: soft, non-tender, non-distended, normal bowel sounds,   no hepatosplenomegaly, no masses  Ext:  Left leg has drain (wound vac) and is in a dressing, not taken down  It was placed in the OR yesterday  Skin: no rashes/lesions Lymph: no cervical, axillary or inguinal adenopathy  Neuro: moves extremities,     Lines:    Lab Review   Hematology  Recent Labs      11/01/16   0417   WBC  10.2   HGB  12.2   HCT  36.2   PLTCT  179     Chemistry  Recent Labs      11/01/16   0417   NA  140   K  3.8   CL  105   CO2  29   BUN  17   CR  0.78   GFR  >60   GLU  112*   CA  8.9       Microbiology, Radiology and other Diagnostics Review   Microbiology data reviewed.          Pertinent radiology images viewed.  Impression:                 Cleophus Molt, MD   Division of Infectious Diseases       11/01/2016

## 2016-11-01 NOTE — Progress Notes
PHYSICAL THERAPY  NOTE    SUBJECTIVE:  Subjective  Significant hospital events: 79F s/p I&D L foot and wnd vac 8/3  Mental / Cognitive Status: Alert;Oriented;Cooperative  Persons Present: Spouse;Friend;Occupational Therapist  Pain: Patient complains of pain;Patient does not rate pain  Pain Location: Left;Foot;Post-surgical  L LE Precautions: LLE Non-Weight Bearing (wound vac in place)  Ambulation Assist: Independent Mobility in Community with Device  Patient Owned Equipment: Advice workeroller Walker;Knee Scooter  Home Situation: Lives with Family  Type of Home: House  Entry Stairs: No Stairs  In-Home Stairs: No Stairs    BED MOBILITY/TRANSFERS:  Patient is presently performing transfers and short distance gait with supervision and use of roller walker.     PLAN:  Patient to OR 8/5 for repeat I&D. Will follow-up 8/6 to evaluate and treat as indicated.    Therapist: Hinda LenisMeaghan Chrisy Hillebrand, PT  Date: 11/01/2016

## 2016-11-01 NOTE — Progress Notes
OCCUPATIONAL THERAPY  NO TREATMENT NOTE     The patient was not seen due to: Plans for OR on 08/05.  Will evaluate post-operatively.  Thank you for this consult.        Therapist: Antionette Polesebeca Sukaina Toothaker, OT  Date: 11/01/2016

## 2016-11-01 NOTE — Progress Notes
OCCUPATIONAL THERAPY  ASSESSMENT NOTE    Patient Name: Joyce Mcdowell                   Room/Bed: ZO1096/04  Admitting Diagnosis: Ankle wound, left, subsequent encounter [S91.002D]  Wound infection  Past Medical History:   Diagnosis Date   ??? Arthritis    ??? DM (diabetes mellitus) (HCC)    ??? Gastrointestinal disorder     gerd   ??? Hyperlipidemia    ??? Hypertension    ??? Osteoarthritis    ??? Psychiatric illness     situational depression   ??? Thyroid disease             Subjective  Dx:77M s/p I&D L foot and wnd vac 8/3  Patient Stated Goals:  (Would like to discharge home with family. )  Precautions: Standard.  Reports history of falls.   L LE Precautions: LLE Non-Weight Bearing (Wound Vac in place. )  Elevate Left foot and limit foot in dependent position.    Pain / Complaints: Patient agrees to participate in therapy  Pain Location: Left;Foot  Pain Level Current:  (Did not rate pain. )    Objective  Psychosocial Status: Willing and Cooperative to Participate  Persons Present:  (Spouse, friend and Physical Therapist.)    Home Living  Type of Home: House  Home Layout: Two Level (Able to stay on main level entering from garage. )  Financial risk analyst / Tub: Pension scheme manager: Midwife: Buyer, retail: Walker;Crutches;Shower/Tub Producer, television/film/video)    Prior Function  Level Of Independence: Independent with ADLs and functional transfers  Lives With: Spouse  Receives Help From: None Needed    Vision  Current Vision:  (No gross deficits noted. )  Ocular Range Of Motion: Within Normal Limits  Tracking: Within Functional Limits    ADL's  Where Assessed:  (Denied concerns with self-cares/transfers.  )  Eating Assist: Independent  Eating Deficits: Setup  Grooming Assist: Independent  Grooming Deficits: Setup  Bathing Assist: Not Performed  UE Dressing Assist: Not Performed  LE Dressing Assist: Not Performed    Upon entrance to room patient sitting upright in bed with Left Lower Extremity elevated on pillows.  Patient communicated that she has been transferring to commode and sink without difficulty.  Denied concerns with transfers.  Patient explained transfer technique and communicated that she maintains NWB status.  Communicated familiarity with NWB status.  Scheduled to return to OR on 08/05.    Activity Tolerance  Endurance: 2/5 Tolerates 10-20 Minutes Exercise w/Multiple Rests    Cognition  Overall Cognitive Status: WFL to Adequately Complete Self Care Tasks Safely  Expression: WFL Adequate to Meet Daily Needs  Orientation: Alert & Oriented x3  Attention: Awake/Alert         UE AROM  Overall BUE AROM WNL: Yes (Reports history of Right UE shoulder injury/repair. )  Reports that Upper Extremities are strong enough to perform self-cares and transfers.           Sensory  Comment:  (No gross deficits noted. )              Education  Persons Educated: Patient/Family  Barriers To Learning: None Noted  Teaching Methods: Verbal Instruction;Demonstration  Patient Response: Verbalized and Demo Understanding  Topics: Role of OT, Goals for Therapy;ADL Compensatory Techniques;Home safety.  NWB status and positioning of foot.      Assessment  Assessment: Decreased High-Level ADLs;Decreased Self-Care  Trans  Prognosis: Good;w/ Family (Spouse able to provide assistance. )    AM-PAC 6 Clicks Daily Activity Inpatient  Putting on and taking off regular lower body clothes?: A Little  Bathing (Including washing, rinsing, drying): A Little  Toileting, which includes using toilet, bedpan, or urinal: A Little  Putting on and taking off regular upper body clothing: None  Taking care of personal grooming such as brushing teeth: None  Eating meals?: None  Daily Activity Raw Score: 21  Standardized (t-scale) score: 44.27  CMS 0-100% Score: 32.79  CMS G Code Modifier: CJ    Plan   OT Frequency: Follow-Up Visit Only              Functional Transfer Goals Pt Will Perform All Functional Transfers: w/ Good Judgment/Safety, Minimum Assist              OT Discharge Recommendations  OT Discharge Recommendations: Home with family assistance  Equipment Recommendations:  (Reports having necessary equipment at home. )        Therapist: Antionette Poles, OT  Date: 11/01/2016

## 2016-11-02 ENCOUNTER — Encounter: Admit: 2016-11-02 | Discharge: 2016-11-02 | Payer: BC Managed Care – PPO

## 2016-11-02 ENCOUNTER — Inpatient Hospital Stay: Admit: 2016-11-02 | Discharge: 2016-11-02 | Payer: BC Managed Care – PPO

## 2016-11-02 LAB — BASIC METABOLIC PANEL
Lab: 140 MMOL/L (ref 137–147)
Lab: 29 MMOL/L — ABNORMAL HIGH (ref 21–30)

## 2016-11-02 LAB — POC GLUCOSE
Lab: 103 mg/dL — ABNORMAL HIGH (ref 70–100)
Lab: 106 mg/dL — ABNORMAL HIGH (ref 70–100)
Lab: 117 mg/dL — ABNORMAL HIGH (ref 70–100)
Lab: 124 mg/dL — ABNORMAL HIGH (ref 70–100)
Lab: 143 mg/dL — ABNORMAL HIGH (ref 70–100)
Lab: 151 mg/dL — ABNORMAL HIGH (ref 70–100)

## 2016-11-02 LAB — CBC: Lab: 9.7 10*3/uL — ABNORMAL LOW (ref >60)

## 2016-11-02 MED ORDER — DEXAMETHASONE SODIUM PHOSPHATE 10 MG/ML IJ SOLN
4 mg | Freq: Once | INTRAVENOUS | 0 refills | Status: AC | PRN
Start: 2016-11-02 — End: ?

## 2016-11-02 MED ORDER — MEPERIDINE (PF) 50 MG/ML IJ SYRG
12.5 mg | INTRAVENOUS | 0 refills | Status: DC | PRN
Start: 2016-11-02 — End: 2016-11-03

## 2016-11-02 MED ORDER — DEXAMETHASONE SODIUM PHOSPHATE 4 MG/ML IJ SOLN
INTRAVENOUS | 0 refills | Status: DC
Start: 2016-11-02 — End: 2016-11-02
  Administered 2016-11-02: 13:00:00 4 mg via INTRAVENOUS

## 2016-11-02 MED ORDER — FENTANYL CITRATE (PF) 50 MCG/ML IJ SOLN
0 refills | Status: DC
Start: 2016-11-02 — End: 2016-11-02
  Administered 2016-11-02 (×2): 50 ug via INTRAVENOUS

## 2016-11-02 MED ORDER — SODIUM CHLORIDE 0.9 % IR SOLN
0 refills | Status: DC
Start: 2016-11-02 — End: 2016-11-03
  Administered 2016-11-02: 13:00:00 1000 mL

## 2016-11-02 MED ORDER — PROMETHAZINE 25 MG/ML IJ SOLN
6.25 mg | INTRAVENOUS | 0 refills | Status: DC | PRN
Start: 2016-11-02 — End: 2016-11-03
  Administered 2016-11-02: 15:00:00 6.25 mg via INTRAVENOUS

## 2016-11-02 MED ORDER — MIDAZOLAM 1 MG/ML IJ SOLN
INTRAVENOUS | 0 refills | Status: DC
Start: 2016-11-02 — End: 2016-11-02
  Administered 2016-11-02: 13:00:00 2 mg via INTRAVENOUS

## 2016-11-02 MED ORDER — VANCOMYCIN 1,000 MG IV SOLR
0 refills | Status: DC
Start: 2016-11-02 — End: 2016-11-03
  Administered 2016-11-02: 14:00:00 1 g via TOPICAL

## 2016-11-02 MED ORDER — LIDOCAINE (PF) 200 MG/10 ML (2 %) IJ SYRG
0 refills | Status: DC
Start: 2016-11-02 — End: 2016-11-02
  Administered 2016-11-02: 13:00:00 100 mg via INTRAVENOUS

## 2016-11-02 MED ORDER — DEXTRAN 70-HYPROMELLOSE (PF) 0.1-0.3 % OP DPET
0 refills | Status: DC
Start: 2016-11-02 — End: 2016-11-02
  Administered 2016-11-02: 13:00:00 2 [drp] via OPHTHALMIC

## 2016-11-02 MED ORDER — HALOPERIDOL LACTATE 5 MG/ML IJ SOLN
1 mg | Freq: Once | INTRAVENOUS | 0 refills | Status: AC | PRN
Start: 2016-11-02 — End: ?

## 2016-11-02 MED ORDER — ONDANSETRON HCL (PF) 4 MG/2 ML IJ SOLN
INTRAVENOUS | 0 refills | Status: DC
Start: 2016-11-02 — End: 2016-11-02
  Administered 2016-11-02: 14:00:00 4 mg via INTRAVENOUS

## 2016-11-02 MED ORDER — FENTANYL CITRATE (PF) 50 MCG/ML IJ SOLN
50 ug | INTRAVENOUS | 0 refills | Status: DC | PRN
Start: 2016-11-02 — End: 2016-11-03
  Administered 2016-11-02 (×2): 50 ug via INTRAVENOUS

## 2016-11-02 MED ORDER — OXYCODONE 5 MG PO TAB
5-10 mg | ORAL_TABLET | ORAL | 0 refills | Status: SS | PRN
Start: 2016-11-02 — End: 2016-12-22

## 2016-11-02 MED ORDER — DOCUSATE SODIUM 100 MG PO CAP
100 mg | ORAL_CAPSULE | Freq: Two times a day (BID) | ORAL | 3 refills | Status: AC
Start: 2016-11-02 — End: 2016-11-03

## 2016-11-02 MED ORDER — SUCCINYLCHOLINE CHLORIDE 20 MG/ML IJ SOLN
INTRAVENOUS | 0 refills | Status: DC
Start: 2016-11-02 — End: 2016-11-02
  Administered 2016-11-02: 13:00:00 160 mg via INTRAVENOUS

## 2016-11-02 MED ORDER — LACTATED RINGERS IV SOLP
INTRAVENOUS | 0 refills | Status: DC
Start: 2016-11-02 — End: 2016-11-03
  Administered 2016-11-02: 13:00:00 1000.000 mL via INTRAVENOUS

## 2016-11-02 MED ORDER — HYDROMORPHONE (PF) 2 MG/ML IJ SYRG
.5-1 mg | INTRAVENOUS | 0 refills | Status: DC | PRN
Start: 2016-11-02 — End: 2016-11-03
  Administered 2016-11-02: 16:00:00 1 mg via INTRAVENOUS

## 2016-11-02 MED ORDER — OXYCODONE 5 MG PO TAB
5-10 mg | Freq: Once | ORAL | 0 refills | Status: CP | PRN
Start: 2016-11-02 — End: ?
  Administered 2016-11-02: 16:00:00 10 mg via ORAL

## 2016-11-02 MED ORDER — FENTANYL CITRATE (PF) 50 MCG/ML IJ SOLN
25 ug | INTRAVENOUS | 0 refills | Status: DC | PRN
Start: 2016-11-02 — End: 2016-11-03

## 2016-11-02 MED ORDER — PROPOFOL INJ 10 MG/ML IV VIAL
0 refills | Status: DC
Start: 2016-11-02 — End: 2016-11-02
  Administered 2016-11-02: 13:00:00 200 mg via INTRAVENOUS

## 2016-11-02 NOTE — Anesthesia Post-Procedure Evaluation
Post-Anesthesia Evaluation    Name: Joyce Mcdowell      MRN: [id number]     DOB: May 03, 1965     Age: 51 y.o.     Sex: female   __________________________________________________________________________     Procedure Date: 11/02/2016  Procedure: Procedure(s):  REPEAT LEFT ANKLE IRRIGATION AND DEBRIDEMENT WITH SECONDARY CLOSURE      Surgeon: Surgeon(s):  Ree ShayHorton, Greg, MD  Orson ApeGrote, Caleb    Post-Anesthesia Vitals  BP: 136/67 (08/05 1030)  Pulse: 102 (08/05 1030)  Respirations: 13 PER MINUTE (08/05 1030)  SpO2: 94 % (08/05 1030)  O2 Delivery: None (Room Air) (08/05 1030)  SpO2 Pulse: 102 (08/05 1030)      Post Anesthesia Evaluation Note    Evaluation location: Pre/Post  Patient participation: recovered; patient participated in evaluation  Level of consciousness: alert    Pain score: 3  Pain management: adequate    Hydration: normovolemia  Temperature: 36.0C - 38.4C  Airway patency: adequate    Perioperative Events  Perioperative events:  no       Post-op nausea and vomiting: no PONV    Postoperative Status  Cardiovascular status: hemodynamically stable  Respiratory status: spontaneous ventilation  Follow-up needed: none        Perioperative Events

## 2016-11-02 NOTE — Progress Notes
S: No acute events.  Pain well-controlled on current regimen. NPO. afebrile.    O:  Blood pressure 157/84, pulse 92, temperature 36.8 C (98.3 F), height 180.3 cm (71"), weight (!) 143.4 kg (316 lb 2.2 oz), SpO2 96 %.   General: AAOx3, NAD  Cardiac: RRR  Respirations: Unlabored  Abdomen: soft, nt  Extremities: left lower extremity compartments soft, wiggles toes , + ankle pf df, SILT, distal cap refill < 2 sec  Incisions:  Clean dry and intact, wound vac in place and holding sx      Post-op Drains: (24 hours)     Wound VAC: 25ml.    No results for input(s): PTT, INR in the last 72 hours.    CBC w/Diff    Lab Results   Component Value Date/Time    WBC 9.7 11/02/2016 04:30 AM    HGB 12.3 11/02/2016 04:30 AM    HCT 36.7 11/02/2016 04:30 AM    PLTCT 171 11/02/2016 04:30 AM            Basic Metabolic Profile    Lab Results   Component Value Date/Time    NA 140 11/02/2016 04:30 AM    K 3.7 11/02/2016 04:30 AM    CL 104 11/02/2016 04:30 AM    CO2 29 11/02/2016 04:30 AM    GAP 7 11/02/2016 04:30 AM    Lab Results   Component Value Date/Time    BUN 15 11/02/2016 04:30 AM    CR 0.79 11/02/2016 04:30 AM    GLU 115 (H) 11/02/2016 04:30 AM        A/P: 23M s/p I&D L foot and wnd vac 8/3    -PT OT  -weight bearing: NWB LLE  -DVT ppx: asa, mobilize, scd  -continue current regimen for pain control  -acute blood loss anemia-Hgb 12.3  -ID. On ancef. Cltx: NGTD    Dispo: OR today. Posted. Consented. NPO@[place] .    Alden HippGrote  [phone]

## 2016-11-02 NOTE — Anesthesia Pre-Procedure Evaluation
Anesthesia Pre-Procedure Evaluation    Name: Joyce Mcdowell      MRN: [id number]     DOB: [birth year]     Age: 51 y.o.     Sex: female   __________________________________________________________________________     Procedure Date: 11/02/2016 06/30/16  Procedure: Procedure(s):  REPEAT LEFT ANKLE IRRIGATION AND DEBRIDEMENT WITH SECONDARY CLOSURE Left lateral ankle ligament reconstruction, secondary peroneal tendon repair, possible allograft, possible FHL transfer     Physical Assessment  Vital Signs (last filed in past 24 hours):  BP: 157/84 (08/05 0630)  Temp: 36.8 ???C (98.3 ???F) (08/05 0630)  Pulse: 92 (08/05 0630)  Respirations: 18 PER MINUTE (08/05 0630)  SpO2: 96 % (08/05 0630)  O2 Delivery: None (Room Air) (08/05 0630)      Patient History  Allergies   Allergen Reactions   ??? Adhesive Tape (Rosins) BLISTERS        Current Medications    Medication Directions   acetaminophen (TYLENOL) 500 mg tablet Take 1,000 mg by mouth three times daily. Max of 4,000 mg of acetaminophen in 24 hours.   aspirin EC 81 mg tablet Take 81 mg by mouth at bedtime daily. Take with food.   atorvastatin (LIPITOR) 20 mg tablet TAKE 1 TABLET BY MOUTH DAILY AT BEDTIME   cephalexin (KEFLEX) 500 mg capsule Take 1 capsule by mouth four times daily.   estradiol (ESTRACE) 1 mg tablet Take 1 mg by mouth at bedtime daily.   FARXIGA 10 mg tab TAKE 1 TABLET BY MOUTH DAILY   fexofenadine(+) (ALLEGRA) 180 mg tablet Take 180 mg by mouth daily.   fluticasone (FLONASE) 50 mcg/actuation nasal spray Apply 1 spray to each nostril as directed twice daily as needed. Shake bottle gently before using.   HYDROcodone/acetaminophen(+) (NORCO) 10/325 mg tablet Take 1 tablet by mouth every 6 hours as needed for Pain 0.5 tab as needed   JANUVIA 100 mg tab tablet TAKE 1 TABLET BY MOUTH DAILY   levothyroxine (SYNTHROID) 125 mcg tablet TAKE 1 TABLET BY MOUTH DAILY   lisinopril (PRINIVIL; ZESTRIL) 20 mg tablet TAKE 1 TABLET BY MOUTH EVERY NIGHT loratadine (CLARITIN) 10 mg tablet Take 10 mg by mouth at bedtime daily.   metFORMIN (GLUCOPHAGE) 500 mg tablet TAKE 1 TABLET BY MOUTH TWICE DAILY   other medication Take 1 Dose by mouth. Medication Name & Strength: Caniboid Oil  Dose(how many): Unknown Frequency(how often): Twice Daily Sublingual   pantoprazole DR (PROTONIX) 40 mg tablet TAKE 1 TABLET BY MOUTH TWICE DAILY   venlafaxine XR (EFFEXOR XR) 75 mg capsule Take 75 mg by mouth daily.   vitamins, multiple tablet Take 1 tablet by mouth daily.         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    Abdominal Findings:       Obese    Neurological Findings:       Normal mental status      Not sedated       Diagnostic Tests  Hematology:   Lab Results   Component Value Date    HGB 12.3 11/02/2016    HCT 36.7 11/02/2016    PLTCT 171 11/02/2016    WBC 9.7 11/02/2016    MCV 90.3 11/02/2016    MCH 30.3 11/02/2016    MCHC 33.6 11/02/2016    MPV 9.8 11/02/2016    RDW 14.6 11/02/2016         General Chemistry:   Lab Results   Component Value Date    NA 140 11/02/2016    K 3.7 11/02/2016    CL 104 11/02/2016    CO2 29 11/02/2016    GAP 7 11/02/2016    BUN 15 11/02/2016    CR 0.79 11/02/2016    GLU 115 11/02/2016    CA 8.9 11/02/2016      Coagulation: No results found for: PT, PTT, INR      Anesthesia Plan    ASA score: 2   Plan: general and regional for postoperative pain  Induction method: intravenous  NPO status: acceptable  Comments: (Patient reports having dental problems after anesthesia on multiple occasions. Her implant fell out yesterday after having anesthesia on 10/31/16. I addressed the risks of General Anesthesia including damage to teeth while placing ETT and she would like to move forward with the procedure today. )      Informed Consent  Anesthetic plan and risks discussed with patient.  Use of blood products discussed with patient; consented to blood products.      Plan  discussed with: anesthesiologist and resident.     Labs: cbc, bmp  Consults: None

## 2016-11-02 NOTE — Progress Notes
Patient arrived on unit via cart accompanied by transport. Patient transferred to the bed with assistance. Assessment completed, refer to flowsheet for details. Orders released, reviewed, and implemented as appropriate. Oriented to surroundings, call light within reach. Plan of care reviewed.  Will continue to monitor and assess.

## 2016-11-02 NOTE — Other
Brief Operative Note    Name: Tally DueRenee Elaine Graver is a 51 y.o. female     DOB: 11/22/1965             MRN#: [id number]  DATE OF OPERATION: 11/02/2016    Date:  11/02/2016        Preoperative Dx:   Open wound of left ankle, subsequent encounter [S91.002D]    Post-op Diagnosis      * Open wound of left ankle, subsequent encounter [S91.002D]    Procedure(s) (LRB):  REPEAT LEFT ANKLE IRRIGATION AND DEBRIDEMENT WITH SECONDARY CLOSURE (Left)    Anesthesia Type: Defer to Anesthesia    Surgeon(s) and Role:     Orson Ape* Yousef Huge - Resident - Assisting     * Ree ShayHorton, Greg, MD - Primary      Findings:  no gross purulence    Estimated Blood Loss: No blood loss documented.     Specimen(s) Removed/Disposition: * No specimens in log *    Complications:  None    Implants: None    Drains: Hemovac: xx mL    Disposition:  PACU - stable    Orson Apealeb Daana Petrasek  Pager [phone]

## 2016-11-02 NOTE — Progress Notes
Received report from PACUDarral Dash- Esther, RN.  Room ready and awaiting patient's arrival.

## 2016-11-03 DIAGNOSIS — E785 Hyperlipidemia, unspecified: ICD-10-CM

## 2016-11-03 DIAGNOSIS — I10 Essential (primary) hypertension: ICD-10-CM

## 2016-11-03 DIAGNOSIS — K219 Gastro-esophageal reflux disease without esophagitis: ICD-10-CM

## 2016-11-03 DIAGNOSIS — Z9049 Acquired absence of other specified parts of digestive tract: ICD-10-CM

## 2016-11-03 DIAGNOSIS — E119 Type 2 diabetes mellitus without complications: ICD-10-CM

## 2016-11-03 DIAGNOSIS — T814XXA Infection following a procedure, initial encounter: Principal | ICD-10-CM

## 2016-11-03 DIAGNOSIS — Z90710 Acquired absence of both cervix and uterus: ICD-10-CM

## 2016-11-03 LAB — POC GLUCOSE
Lab: 126 mg/dL — ABNORMAL HIGH (ref 70–100)
Lab: 132 mg/dL — ABNORMAL HIGH (ref 70–100)
Lab: 107 mg/dL — ABNORMAL HIGH (ref 70–100)

## 2016-11-03 LAB — CULTURE-ANAEROBIC

## 2016-11-03 LAB — CBC: Lab: 13 K/UL — ABNORMAL HIGH (ref >60)

## 2016-11-03 LAB — BASIC METABOLIC PANEL: Lab: 141 MMOL/L (ref >60)

## 2016-11-03 MED ORDER — LEVOFLOXACIN 750 MG PO TAB
750 mg | ORAL_TABLET | Freq: Every day | ORAL | 0 refills | 7.00000 days | Status: AC
Start: 2016-11-03 — End: ?

## 2016-11-03 MED ORDER — SITAGLIPTIN 100 MG PO TAB
100 mg | Freq: Every day | ORAL | 0 refills | 90.00000 days | Status: AC
Start: 2016-11-03 — End: ?

## 2016-11-03 MED ORDER — LEVOFLOXACIN 750 MG PO TAB
750 mg | Freq: Every day | ORAL | 0 refills | Status: DC
Start: 2016-11-03 — End: 2016-11-03

## 2016-11-03 MED ORDER — DOXYCYCLINE HYCLATE 100 MG PO TAB
100 mg | Freq: Two times a day (BID) | ORAL | 0 refills | Status: DC
Start: 2016-11-03 — End: 2016-11-03
  Administered 2016-11-03: 17:00:00 100 mg via ORAL

## 2016-11-03 MED ORDER — DOCUSATE SODIUM 100 MG PO CAP
100 mg | ORAL_CAPSULE | Freq: Two times a day (BID) | ORAL | 3 refills | Status: AC
Start: 2016-11-03 — End: 2017-02-04

## 2016-11-03 MED ORDER — LEVOFLOXACIN 750 MG PO TAB
750 mg | ORAL_TABLET | Freq: Two times a day (BID) | ORAL | 0 refills | 7.00000 days | Status: AC
Start: 2016-11-03 — End: 2016-11-03

## 2016-11-03 MED ORDER — DOXYCYCLINE HYCLATE 100 MG PO TAB
100 mg | ORAL_TABLET | Freq: Two times a day (BID) | ORAL | 0 refills | 8.00000 days | Status: AC
Start: 2016-11-03 — End: ?

## 2016-11-03 NOTE — Case Management (ED)
Case Management Progress Note    NAME:Joyce Mcdowell                          MRN: [id number]              DOB:12-27-1965          AGE: 51 y.o.  ADMISSION DATE: 10/31/2016             DAYS ADMITTED: LOS: 3 days      Today???s Date: 11/03/2016    Plan  Roller walker for home use    Interventions  ? Support      ? Info or Referral      ? Discharge Planning   Discharge Planning: Durable Medical Equipment and Supplies-NCM spoke with patient this am. Pt has knee scooter at home that she feels she can use downstairs, pt would like RW for going upstairs. NCM called/faxed referral with orders to [facility] [phone].   ? Medication Needs      ? Financial      ? Legal      ? Other        Disposition  ? Expected Discharge Date    Expected Discharge Date: 11/03/16  ? Transportation   Does the patient need discharge transport arranged?: No  Transportation Name, Phone and Availability #1: Spouse  Does the patient use Medicaid Transportation?: No  ? Next Level of Care (Acute Psych discharges only)      ? Discharge Disposition                                          Durable Medical Equipment     No service has been selected for the patient.      [facility]     No service has been selected for the patient.      [facility]     No service has been selected for the patient.      [facility]     No service has been selected for the patient.          Case Management Admission Assessment    NAME:Joyce Mcdowell                          MRN: [id number]             DOB:04/12/1965          AGE: 51 y.o.  ADMISSION DATE: 10/31/2016             DAYS ADMITTED: LOS: 3 days      Today???s Date: 11/03/2016    Source of Information: Patient       Plan  Plan: CM Assessment, Assist PRN with SW/NCM Services, Discharge Planning for Home Anticipated    Patient Address/Phone  [address]  [place] [address]  [phone] (home) [phone] (work)    Science writer  Extended Emergency Contact Information Primary Emergency Contact: Archuletta,Scott A  Address: [address]           [place], [place] [address] [employer] Phone: [phone]  Mobile Phone: [phone]  Relation: Spouse    Healthcare Directive  Healthcare Directive: No, patient does not have a healthcare directive      Transportation  Does the patient need discharge transport arranged?:  No  Transportation Name, Phone and Availability #1: Spouse  Does the patient use Medicaid Transportation?: No    Expected Discharge Date  Expected Discharge Date: 11/03/16    Living Situation Prior to Admission  ? Living Arrangements  Type of Residence: Home, independent  Living Arrangements: Spouse/significant other  Financial risk analyst / Tub: Psychologist, counselling  How many levels in the residence?: 2  Can patient live on one level if needed?: Yes  Does residence have entry and/or side stairs?: No  Assistance needed prior to admit or anticipated on discharge: No  Who provides assistance or could if needed?: Spouse  Are they in good health?: Yes  Can support system provide 24/7 care if needed?: Yes  ? Level of Function   Prior level of function: Independent  ? Cognitive Abilities   Cognitive Abilities: Alert and Oriented    Financial Resources  ? Coverage  Primary Insurance: Clinical cytogeneticist Insurance: No insurance  Additional Coverage: RX    ? Source of Income   Source Of Income: Employed  ? Financial Assistance Needed?      Psychosocial Needs  ? Mental Health  Mental Health History: No  ? Substance Use History  Substance Use History Screen: No  ? Other      Current/Previous Services  ? PCP  Mohammed Kindle, [place], [phone], [phone]  ? Pharmacy    [facility] - [place], [place] - [address] AT [facility] [address]  [address]  [place] [address]  Phone: [phone] Fax: [phone]    [facility] - [address] [place], [place] - [address] AT [facility] [address]  [address]  [place] [address] Phone: [phone] Fax: [phone]    [facility])  [phone] [address]  [place]  [address]  Phone: [phone] Fax: [phone]    ? Durable Medical Equipment   Durable Medical Equipment at home: Crutches (knee scooter)  ? Home Health  Receiving home health: No  ? Hemodialysis or Peritoneal Dialysis  Undergoing hemodialysis or peritoneal dialysis: No  ? Tube/Enteral Feeds  Receive tube/enteral feeds: No  ? Infusion  Receive infusions: No  ? Private Duty  Private duty help used: No  ? Home and Community Based Services  Home and community based services: No  ? [employer]: N/A  ? Hospice  Hospice: No  ? Outpatient Therapy  PT: No  OT: No  SLP: No  ? Skilled Nursing Facility/Nursing Home  SNF: No  NH: No  ? Inpatient Rehab  IPR: No  ? Long-Term Acute Care Hospital  LTACH: No  ? Acute Hospital Stay  Acute Hospital Stay: In the past    Leretha Dykes, BSN, RN  Ortho Case Manager  [phone]

## 2016-11-03 NOTE — Progress Notes
S: No acute events.  Pain well-controlled on current regimen. NPO. afebrile.    O:  Blood pressure 126/70, pulse 81, temperature 36.8 C (98.3 F), height 180.3 cm (71"), weight (!) 143.4 kg (316 lb 2.2 oz), SpO2 96 %.   General: AAOx3, NAD  Cardiac: RRR  Respirations: Unlabored  Abdomen: soft, nt  Extremities: left lower extremity compartments soft, wiggles toes ,  SILT, distal cap refill < 2 sec  Incisions:  splint intact, HV in place. Minimal in canister.      Post-op Drains: (24 hours)     HVAC: 0ml.    No results for input(s): PTT, INR in the last 72 hours.    CBC w/Diff    Lab Results   Component Value Date/Time    WBC 13.0 (H) 11/03/2016 04:30 AM    HGB 11.7 (L) 11/03/2016 04:30 AM    HCT 36.0 11/03/2016 04:30 AM    PLTCT 179 11/03/2016 04:30 AM            Basic Metabolic Profile    Lab Results   Component Value Date/Time    NA 141 11/03/2016 04:30 AM    K 3.9 11/03/2016 04:30 AM    CL 104 11/03/2016 04:30 AM    CO2 31 (H) 11/03/2016 04:30 AM    GAP 6 11/03/2016 04:30 AM    Lab Results   Component Value Date/Time    BUN 13 11/03/2016 04:30 AM    CR 0.71 11/03/2016 04:30 AM    GLU 114 (H) 11/03/2016 04:30 AM        A/P: 33M s/p I&D L foot and wnd vac 8/3    -PT OT  -weight bearing: TTWB for transfers only.  -DVT ppx: asa, mobilize, scd  -continue current regimen for pain control  -acute blood loss anemia-Hgb 17.7  -ID. On ancef. Cltx: cornybacterium    Dispo: plan to discharge home with ORal abx. ID final recs.    Alden HippGrote  [id number]

## 2016-11-03 NOTE — Progress Notes
Joyce SeveranceRenee Elaine Mcdowell discharged on 11/03/2016.   Marland Kitchen.  Discharge instructions reviewed with patient.  Valuables returned:   Personal Items / Valuables: Valuables/Belongings home with patient  Where Are Valuables Stored?: With husband, [place]. .  Home medications:    .  Functional assessment at discharge complete: Yes .    Patient left floor via wheelchair accompanied by aide.

## 2016-11-03 NOTE — Anesthesia Pain Rounding
Anesthesia Follow-Up Evaluation: Post-Procedure Day One    Name: Joyce Mcdowell     MRN: [id number]     DOB: [birth year]     Age: 51 y.o.     Sex: female   __________________________________________________________________________     Procedure Date: 11/02/2016   Procedure: Procedure(s):  REPEAT LEFT ANKLE IRRIGATION AND DEBRIDEMENT WITH SECONDARY CLOSURE    Physical Assessment  Height: 180.3 cm (71)  Weight: (!) 143.4 kg (316 lb 2.2 oz)    Vital Signs (Last Filed in 24 hours)  BP: 126/70 (08/06 0541)  Temp: 36.8 ???C (98.3 ???F) (08/06 0541)  Pulse: 81 (08/06 0541)  Respirations: 18 PER MINUTE (08/06 0541)  SpO2: 96 % (08/06 0541)  O2 Delivery: None (Room Air) (08/06 0541)  SpO2 Pulse: 102 (08/05 1030)    Patient History   Allergies  Allergies   Allergen Reactions   ??? Adhesive Tape (Rosins) BLISTERS        Medications  Scheduled Meds:  acetaminophen (TYLENOL) tablet 1,000 mg 1,000 mg Oral TID   aspirin EC tablet 81 mg 81 mg Oral QHS   atorvastatin (LIPITOR) tablet 20 mg 20 mg Oral QHS   ceFAZolin (ANCEF) IVP 2 g 2 g Intravenous Q8H*   docusate (COLACE) capsule 100 mg 100 mg Oral BID(9-17)   estradiol (ESTRACE) tablet 1 mg 1 mg Oral QHS   insulin aspart U-100 (NOVOLOG FLEXPEN) injection PEN 0-7 Units 0-7 Units Subcutaneous 5 X Day   levothyroxine (SYNTHROID) tablet 125 mcg 125 mcg Oral QDAY   loratadine (CLARITIN) tablet 10 mg 10 mg Oral QDAY   metFORMIN (GLUCOPHAGE) tablet 500 mg 500 mg Oral BID   milk of magnesia (CONC) oral suspension 10 mL 10 mL Oral QDAY(21)   pantoprazole DR (PROTONIX) tablet 40 mg 40 mg Oral BID   sitaGLIPtin (JANUVIA) tablet 100 mg 100 mg Oral QDAY   venlafaxine XR (EFFEXOR XR) capsule 75 mg 75 mg Oral QDAY   vitamins, multiple tablet 1 tablet 1 tablet Oral QDAY   Continuous Infusions:  ??? lactated ringers infusion 20 mL/hr at 11/02/16 0734     PRN and Respiratory Meds:acetaminophen Q4H PRN **OR** acetaminophen Q4H PRN, bisacodyl QDAY PRN, diphenhydrAMINE Q6H PRN **OR** diphenhydrAMINE Q6H PRN, fentaNYL citrate PF Q5 MIN PRN, fentaNYL citrate PF Q1H PRN, fentaNYL citrate PF Q5 MIN PRN, fluticasone BID PRN, HYDROmorphone (DILAUDID) injection Q10 MIN PRN, meperidine injection (DEMEROL) syringe PRN, ondansetron (ZOFRAN) IV Q6H PRN, oxyCODONE Q3H PRN, promethazine Q10 MIN PRN, sodium chloride 0.9% irrigation bottle Intra-procedure Med, vancomycin Intra-procedure Med      Diagnostic Tests  Hematology: Lab Results   Component Value Date    HGB 11.7 11/03/2016    HCT 36.0 11/03/2016    PLTCT 179 11/03/2016    WBC 13.0 11/03/2016    MCV 90.5 11/03/2016    MCH 29.3 11/03/2016    MCHC 32.4 11/03/2016    MPV 9.7 11/03/2016    RDW 14.3 11/03/2016         General Chemistry: Lab Results   Component Value Date    NA 141 11/03/2016    K 3.9 11/03/2016    CL 104 11/03/2016    CO2 31 11/03/2016    GAP 6 11/03/2016    BUN 13 11/03/2016    CR 0.71 11/03/2016    GLU 114 11/03/2016    CA 9.1 11/03/2016      Coagulation: No results found for: PT, PTT, INR      Follow-Up Assessment  Patient location during  evaluation: floor      Anesthetic Complications:   Anesthetic complications: The patient did not experience any anesthestic complications.      Pain:  Score: 2    Management:adequate     Level of Consciousness: awake and alert   Hydration:acceptable     Airway Patency: patent   Respiratory Status: acceptable     Cardiovascular Status:acceptable   Regional/Neuroaxial:              Comments: Pt denies N/V and tolerating PO well. Adequate pain control.

## 2016-11-03 NOTE — Progress Notes
PHYSICAL THERAPY  ASSESSMENT/DISCHARGE     MOBILITY:  Mobility  Progressive Mobility Level: Active transfer to chair  Level of Assistance: Stand by assistance  Assistive Device: Walker    SUBJECTIVE:  Subjective  Significant hospital events: 41F s/p I&D L foot and wnd vac 8/3 and 8/5.  Mental / Cognitive Status: Alert;Oriented;Cooperative  Persons Present: Nursing Staff  Pain: Patient complains of pain;Patient does not rate pain  Pain Location: Left;Foot;Post-surgical  L LE Precautions: LLE Toe Touch Weight Bearing (for transfers only)  Ambulation Assist: Independent Mobility in Community with Device  Patient Owned Equipment: Knee Scooter  Home Situation: Lives with Family  Type of Home: House  Entry Stairs: No Stairs  In-Home Stairs: No Stairs  Comments: staying in basement and has set-up for living and traversing stairs if desired from previous ankle surgery. If pt goes upstairs she crawls up, gets to a chair, boosts herself to sit, then transfers to her knee scooter. Pt endorses no changes from her pre-admission baseline.    ROM:  ROM  LE ROM: WFL    STRENGTH:  Strength  Overall Strength: WFL    BED MOBILITY/TRANSFERS:  Bed Mobility/Transfers  Comments: Pt sitting EOB upon arrival preparing for bath.  Transfer Type: Sit to/from Stand  Transfer: Assistance Level: To/From;Bed;Modified Independent  Transfer: Assistive Device: Nurse, adult  Transfers: Type Of Assistance: For Safety Considerations  Other Transfer Type: Stand Pivot  Other Transfer: Assistance Level: To/From;Bed;Commode;Modified Independent  Other Transfer: Assistive Device: Nurse, adult  Other Transfer: Type Of Assistance: For Safety Considerations  End Of Activity Status: Sitting at [facility] of Bed (PCA present)  Comments: Pt maintained TTWB for transfer, NWB for pivot.    GAIT:  Gait  Comments: Did not ambulate with RW as pt uses a knee scooter at home due to prolonged NWB on LLE and bad knee on the R side that she doesn't trust.    EDUCATION: Education  Persons Educated: Patient  Patient Barriers To Learning: None Noted  Teaching Methods: Verbal Instruction  Patient Response: Verbalized Understanding  Topics: Plan/Goals of PT Interventions;Precautions    ASSESSMENT/PROGRESS:  Assessment/Progress  Impaired Mobility Due To: Weight Bearing Restrictions  Assessment/Progress: Patient level of independence and safety is consistent with prior level of function and does not require physical therapy intervention  AM-PAC 6 Clicks Basic Mobility Inpatient  Turning from your back to your side while in a flat bed without using bed rails: None  Moving from lying on your back to sitting on the side of a flatbed without using bedrails : None  Moving to and from a bed to a chair (including a wheelchair): None  Standing up from a chair using your arms (e.g. wheelchair, or bedside chair): None  To walk in hospital room: None  Climbing 3-5 steps with a railing: None  Raw Score: 24  Standardized (T-scale) Score: 57.68  Basic Mobility CMS 0-100%: 0  CMS G Code Modifier for Basic Mobility: CH    PLAN:  Plan   Plan Frequency: No Further Treatment    RECOMMENDATIONS:  PT Discharge Recommendations  PT Discharge Recommendations: Home with Assistance  Equipment Recommendations: Roller Walker     Patient requires the use of a walker with wheels to complete ADL???s in the home including meal preparation, ambulation the bathroom for toileting, bathing and grooming, and safe home mobility.  Patient is unable to complete these ADL???s with a cane or crutch and can safely use the walker.       Therapist: Baird Lyons  Olivia Mackie, PT  Date: 11/03/2016

## 2016-11-03 NOTE — Patient Education
Joyce Mcdowell accepted education and was interactive.  She verbalized understanding.    The following was discussed:  Plan of care  Medications  Pain management  Activity/TTWB  Discharge planning    Follow up should occur as needed.    Continue to address: Discharge planning.    Joyce GingerKayla Maelani Yarbro, RN

## 2016-11-04 ENCOUNTER — Encounter: Admit: 2016-11-04 | Discharge: 2016-11-04 | Payer: BC Managed Care – PPO

## 2016-11-04 DIAGNOSIS — I10 Essential (primary) hypertension: ICD-10-CM

## 2016-11-04 DIAGNOSIS — F99 Mental disorder, not otherwise specified: ICD-10-CM

## 2016-11-04 DIAGNOSIS — E079 Disorder of thyroid, unspecified: ICD-10-CM

## 2016-11-04 DIAGNOSIS — K929 Disease of digestive system, unspecified: ICD-10-CM

## 2016-11-04 DIAGNOSIS — E785 Hyperlipidemia, unspecified: ICD-10-CM

## 2016-11-04 DIAGNOSIS — M1990 Unspecified osteoarthritis, unspecified site: ICD-10-CM

## 2016-11-04 DIAGNOSIS — E119 Type 2 diabetes mellitus without complications: ICD-10-CM

## 2016-11-05 LAB — CULTURE-ANAEROBIC

## 2016-11-05 NOTE — Discharge Instructions - Pharmacy
Physician Discharge Summary    Name: Joyce Mcdowell [facility]  Medical Record Number: [id number]       Account Number:  [id number]  Date Of Birth:  [birth year]                       Age:  51 years  Admit date:  10/31/2016                    Discharge date:  11/03/16    Attending Physician:  Dr. Wilkie Aye            Service: Malachi Bonds    Physician Summary completed by: Orson Ape    Reason for hospitalization: L foot wound    Significant PMH:   Past Medical History:   Diagnosis Date   ??? Arthritis    ??? DM (diabetes mellitus) (HCC)    ??? Gastrointestinal disorder     gerd   ??? Hyperlipidemia    ??? Hypertension    ??? Osteoarthritis    ??? Psychiatric illness     situational depression   ??? Thyroid disease        Allergies: Adhesive tape (rosins)    Brief Hospital Course:     The patient was admitted with the following History of Present Illness: Joyce Mcdowell is a 51 y.o. female.  S/p ankle stabilization.  Had a post operative hematoma.  Has had a persistent wound with some drainage.  Here for I&D   These issues were addressed as follows: The patient was taken to the OR on 8/3 and 8/5. The patient underwent below procedures. The post-operative course was complicated by +intra-operative cultures. ID was consulted and was sent home on PO abx. The patient was deemed ready for discharge on 8/6, and was sent home with the instructions reproduced below.    Admission Physical Exam notable for:    General: AAOx3, NAD  Psych: Mood/Affect appropriate  ENT: Oropharynx/Nasopharynx clear  Respiratory: Unlabored respirations  Cardio: Regular  GI: soft  Vascular:  Skin:Wound over lateral ankle with serous drainage  Musculoskeletal: ankle stable  Neurologic: grossly intact    Admission Lab/Radiology studies notable for:   none    Surgical Procedures:  INCISION AND DRAINAGE OF LEFT ANKLE. (Left)  WOUND VAC PALCEMENT LEFT ANKLE (Left)  REMOVAL HARDWARE -DEEP (Left)  REPEAT LEFT ANKLE IRRIGATION AND DEBRIDEMENT WITH SECONDARY CLOSURE (Left) Condition at Discharge: Stable    Discharge Diagnoses:     Ankle wound, left, subsequent encounter [S91.002D]   post operative wound infection      Significant Diagnostic Studies and Procedures:  Noted in brief hospital course.    Consults:  ID    Patient Disposition: Home       Patient instructions/medications:      Activity as Tolerated   It is important to keep increasing your activity level after you leave the hospital.  Moving around can help prevent blood clots, lung infection (pneumonia) and other problems.  Gradually increasing the number of times you are up moving around will help you return to your normal activity level more quickly.  Continue to increase the number of times you are up to the chair and walking daily to return to your normal activity level.   - Increase activity as tolerated while maintaining your restrictions until further instructions at your follow up appointment  - Increase walking as able and avoid sitting/standing/laying in one position for long periods of time  - Perform exercises as instructed  by physical therapy 2-3 times a day     Restrictions for Left Leg   Partial weight bearing: Only place weight on your affected leg as instructed by your physician or physical therapist.  Continue these restrictions until follow up appointment.    Foot flat for transfers.     Report These Signs and Symptoms   Contact your doctor if you have any of the following symptoms:    *temperature higher than 100 degrees  *uncontrolled pain  *persistenet nausea and/or vomiting  *calf pain or tenderness  *unable to urinate  *unable to have a bowel movement  *continued or increased drainage    Ongoing swelling of your surgical leg can occur for 3 to 6 months. Bruising is very common as well but should decrease within a few weeks.    Call 911 if you experience difficulty breathing, chest pain, or severe calf pain or swelling.     Questions About Your Stay For questions or concerns regarding your hospital stay:    DURING BUSINESS HOURS (8:00 AM - 4:30 PM)  Call the Orthopedic clinic at [phone]    AFTER BUSINESS HOURS AND WEEKENDS  Call [phone] and ask the operator to page the on-call Orthopedic Resident.   Discharging attending physician: Ree Shay [[id number]]      Diabetic Diet   You should eat between 1600 and 2000 calories per day.  This is equal to 60g (grams) of carbohydrates per meal, and 30g of carbohydrates for a bedtime snack.    While taking pain medications:  - Drink plenty of fluids (not including alcohol)  - Add extra fiber to your diet  - Continue your stool softeners to help prevent constipation    If you have questions about your diet after you go home, you can call a dietitian at [phone].     Wound Care   Keep wound clean and dry. Do not submerge under water.     Incision Care   Maintain splint until f/u. Keep clean and dry.     Hemovac Drain Care   OK to remove if less than 30ml output in 24hrs.     *WASH HANDS PRIOR TO ANY HANDLING OF DRAIN.   *Please write down daily (total for 24 hours) drain output, and empty multiple times a day as needed or according to physician's instructions.   *To empty the drain, open the lid at the top of the drain to release suction.  The spring device will relax completely.  Then pour out the contents into measuring cup and record.  To re-engage the drain, place on a hard surface and press down on the drain firmly until spring is compressed.  Close the lid to recreate suction.   *Bring the drain record to your next clinic appointment.   *Please secure the drain to your clothes using a safety pin.   *DO NOT TRY TO PUSH THE DRAIN BACK IN IF IT GETS PULLED OUT (even a little bit), AS THIS IS A BIG INFECTION RISK.  If you have any problems with the drain (for example, large amounts of fluid, very bloody fluid, or loss of suction), please call your doctor.     Return Appointment Please call [phone] or [phone] to make or confirm post op appointment.   [place] [facility] [place] [address] [place] [[id number]]      Opioid (Narcotic) Safety Information   OPIOID (NARCOTIC) PAIN MEDICATION SAFETY    We care about your comfort, and believe you need opioid medications at this  time to treat your pain.  An opioid is a strong pain medication.  It is only available by prescription for moderate to severe pain.  Usually these medications are used for only a short time to treat pain, but sometimes will be prescribed for longer.  Talk with your doctor or nurse about how long they expect you to need this medication.    When used the right way, opioids are safe and effective medications to treat your pain, even when used for a long time.  Yet, when used in the wrong way, opioids can be dangerous for you or others.  Opioids do not work for everyone.  Most patients do not get full relief of their pain from opioid medication; full relief of your pain may not be possible.     For your safety, we ask you to follow these instructions:    *Only take your opioid medication as prescribed.  If your pain is not controlled with the prescribed dose, or the medication is not lasting long enough, call your doctor.  *Do not break or crush your opioid medication unless your doctor or pharmacist says you can.  With certain medications, this can be dangerous, and may cause death.  *Never share your medications with others, even if they appear to have a good reason.  Never take someone else's pain medication-this is dangerous, and illegal (a crime).  Overdoses and deaths have occurred.  *Keep your opioid medications safe, as you would with cash, in a lock box or similar container.  *Make sure your opioids are going to be secure, especially if you are around children or teens.  *Talk with your doctor or pharmacist before you take other medications.  *Avoid driving, operating machinery, or drinking alcohol while taking opioid pain medication.  This may be unsafe.    Pain medications can cause constipation. Constipation is bowel movements that are less often than normal. Stools often become very hard and difficult to pass. This may lead to stomach pain and bloating. It may also cause pain when trying to use the bathroom. Constipation may be treated with suppositories, laxatives or stool softeners. A diet high in fiber with plenty of fluids helps to maintain regular, soft bowel movements.        Current Discharge Medication List       START taking these medications    Details   docusate (COLACE) 100 mg capsule Take 1 capsule by mouth twice daily.  Qty: 180 capsule, Refills: 3    PRESCRIPTION TYPE:  Normal  Comments: continue while on narcotic pain medication      doxycycline (VIBRAMYCIN) 100 mg tablet Take 1 tablet by mouth twice daily for 14 days.  Qty: 28 tablet, Refills: 0    PRESCRIPTION TYPE:  Normal      levoFLOXacin (LEVAQUIN) 750 mg tablet Take 1 tablet by mouth daily for 14 days.  Qty: 14 tablet, Refills: 0    PRESCRIPTION TYPE:  Normal      oxyCODONE (ROXICODONE, OXY-IR) 5 mg tablet Take 1-2 tablets by mouth every 4 hours as needed  Qty: 75 tablet, Refills: 0    PRESCRIPTION TYPE:  Print          CONTINUE these medications which have been CHANGED or REFILLED    Details   sitaGLIPtin (JANUVIA) 100 mg tab tablet Take 1 tablet by mouth daily.    PRESCRIPTION TYPE:  No Print          CONTINUE these medications which have NOT  CHANGED    Details   acetaminophen (TYLENOL) 500 mg tablet Take 1,000 mg by mouth three times daily. Max of 4,000 mg of acetaminophen in 24 hours.    PRESCRIPTION TYPE:  Historical Med      aspirin EC 81 mg tablet Take 81 mg by mouth at bedtime daily. Take with food.    PRESCRIPTION TYPE:  Historical Med      atorvastatin (LIPITOR) 20 mg tablet TAKE 1 TABLET BY MOUTH DAILY AT BEDTIME  Refills: 0    PRESCRIPTION TYPE:  Historical Med celecoxib (CELEBREX) 200 mg capsule Take 200 mg by mouth twice daily.    PRESCRIPTION TYPE:  Historical Med      dapagliflozin 10 mg tab Take 1 tablet by mouth daily.    PRESCRIPTION TYPE:  Historical Med      estradiol (ESTRACE) 1 mg tablet Take 1 mg by mouth at bedtime daily.  Refills: 0    PRESCRIPTION TYPE:  Historical Med      fexofenadine(+) (ALLEGRA) 180 mg tablet Take 180 mg by mouth daily.    PRESCRIPTION TYPE:  Historical Med      fluticasone (FLONASE) 50 mcg/actuation nasal spray Apply 1 spray to each nostril as directed twice daily as needed. Shake bottle gently before using.    PRESCRIPTION TYPE:  Historical Med      levothyroxine (SYNTHROID) 125 mcg tablet TAKE 1 TABLET BY MOUTH DAILY  Refills: 3    PRESCRIPTION TYPE:  Historical Med      lisinopril (PRINIVIL; ZESTRIL) 20 mg tablet TAKE 1 TABLET BY MOUTH EVERY NIGHT  Refills: 4    PRESCRIPTION TYPE:  Historical Med      metFORMIN (GLUCOPHAGE) 500 mg tablet TAKE 1 TABLET BY MOUTH TWICE DAILY  Refills: 5    PRESCRIPTION TYPE:  Historical Med      other medication Take 1 Dose by mouth. Medication Name & Strength: Caniboid Oil  Dose(how many): Unknown Frequency(how often): Twice Daily Sublingual    PRESCRIPTION TYPE:  Historical Med      pantoprazole DR (PROTONIX) 40 mg tablet TAKE 1 TABLET BY MOUTH TWICE DAILY  Refills: 2    PRESCRIPTION TYPE:  Historical Med      venlafaxine XR (EFFEXOR XR) 75 mg capsule Take 75 mg by mouth daily.  Refills: 2    PRESCRIPTION TYPE:  Historical Med      vitamins, multiple tablet Take 1 tablet by mouth daily.    PRESCRIPTION TYPE:  Historical Med          The following medications were removed from your list. This list includes medications discontinued this stay and those removed from your prior med list in our system        cephalexin (KEFLEX) 500 mg capsule        HYDROcodone/acetaminophen(+) (NORCO) 10/325 mg tablet        loratadine (CLARITIN) 10 mg tablet Pending items needing follow up: Alphonzo Severance Dominic was instructed to follow up as indicated above.    Orson Ape  Pager [phone]  11/05/2016    cc:  Primary Care Physician:  Verified  Referring physicians:    Additional provider(s):

## 2016-11-06 NOTE — Operative Report(Direct Entry)
OPERATIVE REPORT    Name: Joyce Mcdowell is a 51 y.o. 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  Recently presented to the operating room for irrigation debridement 2 days ago presents for repeat debridement and secondary closure of her wound today.  Risks benefits and alternative treatments were explained to patient she is agreement with surgical intervention.    Description and Findings of Operative Procedure: Patient was identified in the preoperative holding area.  Surgical site was marked by the attending surgeon.  Written surgical consent was obtained.  Patient was transported to the operating theater where general anesthesia was induced without palpitation.  Patient was positioned in the sloppy lateral positions. tourniquet was applied.  Patient was kept on her scheduled antibiotics.  Surgical timeout was performed identifying the correct patient correct limb and correct procedure.  Her wound VAC was removed.  Left lower extremities then prepped and draped in normal sterile fashion.      Inspection of the wound shows a clean wound bed with no evidence of gross peri-lesion.  Sharp excisional debridement was then performed with a 15 blade scalpel curettes and rongeurs.  This included excision of all nonviable tissue of skin, subcutaneous tissues, and bone.  The area was then thoroughly irrigated with 3 L normal saline.  The defect in her talar neck from removal of deep implant on previous procedure was then bone grafted with local bone graft from the calcaneus.  This is done with a 1 cm incision over the lateral aspect calcaneus and bone graft was harvested with traveling.  This filled the bone defect very nicely.  Lateral ankle was then stabilized with 0 PDS through the lateral ankle ligaments and tied on the outside of the skin to separate stabilizing sutures were used.  Hemovac was placed.  The wound was then closed 2-0 and 3-0 PDS.  And Dermabond.  Well-padded in the splint was then applied.  Patient was awoken from anesthesia without complication.  Patient was then transferred to the PACU in stable condition.  Dr. Wilkie Aye was present and scrubbed for all critical aspects of this case.    Postoperative recommendations: Patient's cultures from previous surgery will be followed infectious disease is guiding antibiotic treatment.  Plan for discharge from the hospital with close follow-up antibiotics.  Infectious disease team.  She will be touchdown weightbearing for transfers only.  Estimated Blood Loss:  No blood loss documented.     Specimen(s) Removed/Disposition: none      Orson Ape  Pager [phone]

## 2016-11-08 LAB — AEROBIC BACTERIA, SUSCEPT

## 2016-11-10 ENCOUNTER — Encounter: Admit: 2016-11-10 | Discharge: 2016-11-10 | Payer: BC Managed Care – PPO

## 2016-11-11 ENCOUNTER — Encounter: Admit: 2016-11-11 | Discharge: 2016-11-11 | Payer: BC Managed Care – PPO

## 2016-11-11 ENCOUNTER — Ambulatory Visit: Admit: 2016-11-11 | Discharge: 2016-11-11 | Payer: BC Managed Care – PPO

## 2016-11-11 ENCOUNTER — Ambulatory Visit: Admit: 2016-11-11 | Discharge: 2016-11-12 | Payer: BC Managed Care – PPO

## 2016-11-11 DIAGNOSIS — M1990 Unspecified osteoarthritis, unspecified site: Principal | ICD-10-CM

## 2016-11-11 DIAGNOSIS — K929 Disease of digestive system, unspecified: ICD-10-CM

## 2016-11-11 DIAGNOSIS — I10 Essential (primary) hypertension: ICD-10-CM

## 2016-11-11 DIAGNOSIS — B948 Sequelae of other specified infectious and parasitic diseases: ICD-10-CM

## 2016-11-11 DIAGNOSIS — F99 Mental disorder, not otherwise specified: ICD-10-CM

## 2016-11-11 DIAGNOSIS — E119 Type 2 diabetes mellitus without complications: ICD-10-CM

## 2016-11-11 DIAGNOSIS — E079 Disorder of thyroid, unspecified: ICD-10-CM

## 2016-11-11 DIAGNOSIS — S91002D Unspecified open wound, left ankle, subsequent encounter: Principal | ICD-10-CM

## 2016-11-11 DIAGNOSIS — E785 Hyperlipidemia, unspecified: ICD-10-CM

## 2016-11-11 DIAGNOSIS — Z792 Long term (current) use of antibiotics: Principal | ICD-10-CM

## 2016-11-11 DIAGNOSIS — T8140XD Infection following a procedure, unspecified, subsequent encounter: ICD-10-CM

## 2016-11-11 LAB — CULTURE-WOUND/TISSUE/FLUID(AEROBIC ONLY)W/SENSITIVITY

## 2016-11-11 NOTE — Progress Notes
Date of Service: 11/11/2016    CC:     Left ankle wound        Joyce Mcdowell is a 51 y.o. female.    History of Present Illness  Pt with soft tissue infection of the left ankle wound, post op. Had non healing related to hematoma after ligament repair in April.  Synovial fluid was reportedly leaking at [facility] wound care.    She was taken to the OR, vanco powder placed in wound, and cultures taken. Grew 7 col of Corynebacterium pseudodiphtheriticum.  The S showed no resistance on the 4 drugs tested at [facility] labs. Tetra and levo not tested. That was resulted on 11/08/16.  She had been placed on orals only doxycycline and levofloxacin. She had tolerated these ok and had no sx related.  She has had good wound healing this time with no drainage and no erythema of the ankle. She has about one week of the 2 weeks supply given.  The sutures look good at this time too.        Component 11d ago   Aerobic Bacteria, Suscept FINAL 11/08/2016 1438   SOURCE: ANKLE, TISSUE LEFT ANKLE, CORYNE   PSEUDODIPHTHERITICUM MALDI   SUSCEPTIBILITY, AEROBIC, MIC ??? ??? ??? ??? ??? ??? ??? ??? ??? ??? ??? ??? ??? FINAL   CORYNEBACTERIUM PSEUDODIPHTHERITICUM ???   Organism identified by client.   ----------------------------------------------------------   Organism ??? ??? CORYNEBACTERIUM PSEUDODIPHTHERITICUM   Antibiotic ???MIC (mcg/mL) ???Interpretation   ----------------------------------------------------------   Penicillin ??? ??? ??? <=0.06 ??? ??? ??? S   Ceftriaxone ??? ??? ??? <=0.5 ??? ??? ??? S   Meropenem ??? ??? ??? ???<=0.25 ??? ??? ??? S   Vancomycin ??? ??? ??? ??? ???<=1 ??? ??? ??? S   ------------------------------------------------------------   S=SUSCEPTIBLE ???I=INTERMEDIATE ???R=RESISTANT   NS=NONSUSCEPTIBLE ???SDD=SUSCEPTIBLE DOSE DEPENDENT   ------------------------------------------------------------   [facility]        Resulting Agency [facility]      Specimen Collected: 10/31/16 12:32 Last Resulted: 11/08/16 14:41                       View SmartLink Info                   Review of Systems All other systems reviewed and are negative.        Objective:         ??? acetaminophen (TYLENOL) 500 mg tablet Take 1,000 mg by mouth three times daily. Max of 4,000 mg of acetaminophen in 24 hours.   ??? aspirin EC 81 mg tablet Take 81 mg by mouth at bedtime daily. Take with food.   ??? atorvastatin (LIPITOR) 20 mg tablet TAKE 1 TABLET BY MOUTH DAILY AT BEDTIME   ??? celecoxib (CELEBREX) 200 mg capsule Take 200 mg by mouth twice daily.   ??? dapagliflozin 10 mg tab Take 1 tablet by mouth daily.   ??? docusate (COLACE) 100 mg capsule Take 1 capsule by mouth twice daily.   ??? doxycycline (VIBRAMYCIN) 100 mg tablet Take 1 tablet by mouth twice daily for 14 days.   ??? estradiol (ESTRACE) 1 mg tablet Take 1 mg by mouth at bedtime daily.   ??? fexofenadine(+) (ALLEGRA) 180 mg tablet Take 180 mg by mouth daily.   ??? fluticasone (FLONASE) 50 mcg/actuation nasal spray Apply 1 spray to each nostril as directed twice daily as needed. Shake bottle gently before using.   ??? levoFLOXacin (LEVAQUIN) 750 mg tablet  Take 1 tablet by mouth daily for 14 days.   ??? levothyroxine (SYNTHROID) 125 mcg tablet TAKE 1 TABLET BY MOUTH DAILY   ??? lisinopril (PRINIVIL; ZESTRIL) 20 mg tablet TAKE 1 TABLET BY MOUTH EVERY NIGHT   ??? metFORMIN (GLUCOPHAGE) 500 mg tablet TAKE 1 TABLET BY MOUTH TWICE DAILY   ??? other medication Take 1 Dose by mouth. Medication Name & Strength: Caniboid Oil  Dose(how many): Unknown Frequency(how often): Twice Daily Sublingual   ??? oxyCODONE (ROXICODONE, OXY-IR) 5 mg tablet Take 1-2 tablets by mouth every 4 hours as needed   ??? pantoprazole DR (PROTONIX) 40 mg tablet TAKE 1 TABLET BY MOUTH TWICE DAILY   ??? sitaGLIPtin (JANUVIA) 100 mg tab tablet Take 1 tablet by mouth daily.   ??? venlafaxine XR (EFFEXOR XR) 75 mg capsule Take 75 mg by mouth daily.   ??? vitamins, multiple tablet Take 1 tablet by mouth daily.     There were no vitals filed for this visit.  There is no height or weight on file to calculate BMI.     Physical Exam Constitutional: She is oriented to person, place, and time. She appears well-developed and well-nourished.   HENT:   Head: Normocephalic and atraumatic.   Right Ear: External ear normal.   Left Ear: External ear normal.   Nose: Nose normal.   Mouth/Throat: Oropharynx is clear and moist.   Eyes: Conjunctivae and EOM are normal. Pupils are equal, round, and reactive to light.   Neck: Normal range of motion. Neck supple.   Cardiovascular: Normal rate, regular rhythm and normal heart sounds.    Pulmonary/Chest: Effort normal and breath sounds normal.   Abdominal: Soft.   Musculoskeletal: Normal range of motion.   Neurological: She is alert and oriented to person, place, and time.   Skin: Skin is warm and dry.   Psychiatric: She has a normal mood and affect. Her behavior is normal. Judgment and thought content normal.   Vitals reviewed.           Assessment and Plan:  Antibiotic indications  Left ankle wound, post op, hematoma, and ligamentous involvement  No bone but soft tissue involvement only on exam at OR  ???  Microbes isolated  07/22/2016 moderate Staphylococcus coagulase-negative  10/10/2016 left lateral ankle Corynebacterium species  10/31/2016 Corynebacterium pseudodiphtheriticum 7 colonies  S to those agents tested so likely S to tetra or levo based on the progress  ???  Antibiotic selected  Given vancomycin powder in wound at OR  Followed by orals for the past week   Doxycycline 100 mg po BID  Levofloxacin 750 mg po daily  ???  Start dates, projected duration, stop dates  Doxycycline start 11/03/16 with 14 days planned  Levofloxacin start 11/03/16 with 14 days planned  ???  Monitor  Doing well at present  Please call if she has ANY drainage, erythema or pain.  RTC ID PRN if needed  ???  Cristopher Estimable, MD  [phone]

## 2016-11-11 NOTE — Progress Notes
She is here today for initial postoperative follow up s/p left ankle I&D.  She is doing well.  On exam her wounds look good.  The patient is progressing on the expected postoperative course.  I discussed the intraoperative findings and the surgical treatment performed.  She can go into a cam walker so that we can monitor this.  She can put her foot flat for balance, but I don't want her walking on it.  I have recommended that she paint the wound with a bit of Betadine.  She has seen Dr. Willy EddyHinthorn today as well.  I will see her back in two weeks.    In the presence of Ree ShayGreg Jarelle Ates, MD,  I have taken down these notes, Danielle RankinStacy Grove, Safeco CorporationScribe. 11/11/2016 4:27 PM

## 2016-11-21 ENCOUNTER — Encounter: Admit: 2016-11-21 | Discharge: 2016-11-21 | Payer: BC Managed Care – PPO

## 2016-11-21 NOTE — Progress Notes
Received pics via e-mail from Ms. Dziedzic in regards to her concern of drainage from her incision for 4 days.  She has been suing her scooter to get from her car to the classroom, and has been walking around the classroom and around her home.  Per Dr. Winnifred Friar review, drainage is not concerning at this time. Ms. Guye notified and verbalized understanding.

## 2016-11-27 ENCOUNTER — Ambulatory Visit: Admit: 2016-11-27 | Discharge: 2016-11-27 | Payer: BC Managed Care – PPO

## 2016-11-27 ENCOUNTER — Encounter: Admit: 2016-11-27 | Discharge: 2016-11-27 | Payer: BC Managed Care – PPO

## 2016-11-27 DIAGNOSIS — E079 Disorder of thyroid, unspecified: ICD-10-CM

## 2016-11-27 DIAGNOSIS — S91002D Unspecified open wound, left ankle, subsequent encounter: Principal | ICD-10-CM

## 2016-11-27 DIAGNOSIS — M1990 Unspecified osteoarthritis, unspecified site: Secondary | ICD-10-CM

## 2016-11-27 DIAGNOSIS — E785 Hyperlipidemia, unspecified: ICD-10-CM

## 2016-11-27 DIAGNOSIS — E119 Type 2 diabetes mellitus without complications: ICD-10-CM

## 2016-11-27 DIAGNOSIS — K929 Disease of digestive system, unspecified: ICD-10-CM

## 2016-11-27 DIAGNOSIS — I10 Essential (primary) hypertension: ICD-10-CM

## 2016-11-27 DIAGNOSIS — F99 Mental disorder, not otherwise specified: ICD-10-CM

## 2016-11-27 NOTE — Progress Notes
She is here today for postoperative follow up s/p left ankle I&D.  She is doing well.  On exam her wound has dehisced slightly.  I have gone ahead and placed a couple of retention sutures in this today.  She was instructed on local wound care.      In the presence of Ree ShayGreg Todrick Siedschlag, MD,  I have taken down these notes, Ree ShayGreg Tayveon Lombardo, MD, Scribe. 11/27/2016 2:24 PM

## 2016-12-01 LAB — CULTURE-FUNGAL,OTHER

## 2016-12-15 ENCOUNTER — Encounter: Admit: 2016-12-15 | Discharge: 2016-12-15 | Payer: BC Managed Care – PPO

## 2016-12-15 LAB — CULTURE-TB (AFB)

## 2016-12-15 MED ORDER — CEPHALEXIN 500 MG PO CAP
500 mg | ORAL_CAPSULE | Freq: Four times a day (QID) | ORAL | 0 refills | Status: SS
Start: 2016-12-15 — End: 2016-12-22

## 2016-12-15 NOTE — Progress Notes
Received the following via e-mail,     "Thanks. I was going to email you.Marland KitchenMarland KitchenI have a couple of concerns. I am seeing Dr. Wilkie Aye on Thursday, but wanted to give you a heads up. I have been running low grade temp for the last few days (between 99.3 and 100.6), my incision has become more painful and red. The tissue under the stitches almost looks white and my pain level has really been a concern. I'm sorry to bother you and freak out about this, but I'm so worried about the maceration and infection I dealt with in the past.     From April to August, I packed the wound per instructions with the following: wet to dry, then calcium alginate, then iodoform...still landed back in your office.    I just want to know if I need to change the way I'm caring for this. I can send you photos from Saturday and Sunday if you like. Thank you so much! This long saga has just made me a little skiddish.     Thank you,  Joyce Mcdowell'"

## 2016-12-18 ENCOUNTER — Encounter: Admit: 2016-12-18 | Discharge: 2016-12-18 | Payer: BC Managed Care – PPO

## 2016-12-18 ENCOUNTER — Ambulatory Visit: Admit: 2016-12-18 | Discharge: 2016-12-18 | Payer: BC Managed Care – PPO

## 2016-12-18 ENCOUNTER — Encounter: Admit: 2016-12-18 | Discharge: 2016-12-19 | Payer: BC Managed Care – PPO

## 2016-12-18 DIAGNOSIS — M1990 Unspecified osteoarthritis, unspecified site: Principal | ICD-10-CM

## 2016-12-18 DIAGNOSIS — E079 Disorder of thyroid, unspecified: ICD-10-CM

## 2016-12-18 DIAGNOSIS — E119 Type 2 diabetes mellitus without complications: ICD-10-CM

## 2016-12-18 DIAGNOSIS — S91332D Puncture wound without foreign body, left foot, subsequent encounter: Principal | ICD-10-CM

## 2016-12-18 DIAGNOSIS — E785 Hyperlipidemia, unspecified: ICD-10-CM

## 2016-12-18 DIAGNOSIS — F99 Mental disorder, not otherwise specified: ICD-10-CM

## 2016-12-18 DIAGNOSIS — S91002D Unspecified open wound, left ankle, subsequent encounter: ICD-10-CM

## 2016-12-18 DIAGNOSIS — I10 Essential (primary) hypertension: ICD-10-CM

## 2016-12-18 DIAGNOSIS — K929 Disease of digestive system, unspecified: ICD-10-CM

## 2016-12-18 MED ORDER — GADOBENATE DIMEGLUMINE 529 MG/ML (0.1MMOL/0.2ML) IV SOLN
20 mL | Freq: Once | INTRAVENOUS | 0 refills | Status: CP
Start: 2016-12-18 — End: ?
  Administered 2016-12-18: 20 mL via INTRAVENOUS

## 2016-12-18 NOTE — Progress Notes
She is here today for postoperative follow up wound check.  She states that she has had persistent drainage, but on Monday it flared up and started bleeding.  On exam her wound is immature and draining.  It measures 3x2x2 today.  I am going to culture her wound today.  I have taken out the remaining sutures.  I want to get an MRI scan of this with and without constrast.  I think that she is likely going to require an I&D of this wound.  I would also like to get a wound VAC ordered for this.    In the presence of Ree Shay, MD,  I have taken down these notes, Danielle Rankin, [employer].

## 2016-12-18 NOTE — Progress Notes
Wound VAC orders, office notes and op notes faxed to [facility] (fax [phone]).

## 2016-12-19 LAB — GRAM STAIN

## 2016-12-21 ENCOUNTER — Ambulatory Visit: Admit: 2016-12-21 | Discharge: 2016-12-21 | Payer: BC Managed Care – PPO

## 2016-12-21 DIAGNOSIS — T8189XA Other complications of procedures, not elsewhere classified, initial encounter: Secondary | ICD-10-CM

## 2016-12-21 LAB — CULTURE-WOUND/TISSUE/FLUID(AEROBIC ONLY)W/SENSITIVITY

## 2016-12-22 ENCOUNTER — Encounter: Admit: 2016-12-22 | Discharge: 2016-12-22 | Payer: BC Managed Care – PPO

## 2016-12-22 ENCOUNTER — Ambulatory Visit: Admit: 2016-12-22 | Discharge: 2016-12-23 | Payer: BC Managed Care – PPO

## 2016-12-22 ENCOUNTER — Encounter: Admit: 2016-12-22 | Discharge: 2016-12-23 | Payer: BC Managed Care – PPO

## 2016-12-22 ENCOUNTER — Ambulatory Visit: Admit: 2016-12-22 | Discharge: 2016-12-22 | Payer: BC Managed Care – PPO

## 2016-12-22 DIAGNOSIS — E119 Type 2 diabetes mellitus without complications: ICD-10-CM

## 2016-12-22 DIAGNOSIS — F99 Mental disorder, not otherwise specified: ICD-10-CM

## 2016-12-22 DIAGNOSIS — E079 Disorder of thyroid, unspecified: ICD-10-CM

## 2016-12-22 DIAGNOSIS — I10 Essential (primary) hypertension: ICD-10-CM

## 2016-12-22 DIAGNOSIS — M1990 Unspecified osteoarthritis, unspecified site: Secondary | ICD-10-CM

## 2016-12-22 DIAGNOSIS — E785 Hyperlipidemia, unspecified: ICD-10-CM

## 2016-12-22 DIAGNOSIS — K929 Disease of digestive system, unspecified: ICD-10-CM

## 2016-12-22 LAB — POC GLUCOSE
Lab: 93 mg/dL (ref 70–100)
Lab: 96 mg/dL (ref 70–100)

## 2016-12-22 MED ORDER — DOCUSATE SODIUM 100 MG PO CAP
100 mg | Freq: Two times a day (BID) | ORAL | 0 refills | Status: CN
Start: 2016-12-22 — End: ?

## 2016-12-22 MED ORDER — ROCURONIUM 10 MG/ML IV SOLN
INTRAVENOUS | 0 refills | Status: DC
Start: 2016-12-22 — End: 2016-12-22
  Administered 2016-12-22: 22:00:00 40 mg via INTRAVENOUS

## 2016-12-22 MED ORDER — LEVOTHYROXINE 125 MCG PO TAB
125 ug | Freq: Every day | ORAL | 0 refills | Status: CN
Start: 2016-12-22 — End: ?

## 2016-12-22 MED ORDER — DIPHENHYDRAMINE HCL 50 MG/ML IJ SOLN
25 mg | Freq: Once | INTRAVENOUS | 0 refills | Status: DC | PRN
Start: 2016-12-22 — End: 2016-12-23

## 2016-12-22 MED ORDER — SULFAMETHOXAZOLE-TRIMETHOPRIM 800-160 MG PO TAB
1 | ORAL_TABLET | Freq: Two times a day (BID) | ORAL | 0 refills | Status: AC
Start: 2016-12-22 — End: ?
  Filled 2016-12-23 (×2): qty 20, 10d supply, fill #1

## 2016-12-22 MED ORDER — LISINOPRIL 20 MG PO TAB
20 mg | Freq: Every day | ORAL | 0 refills | Status: CN
Start: 2016-12-22 — End: ?

## 2016-12-22 MED ORDER — ERTAPENEM IVPB
1.5 g | INTRAVENOUS | 0 refills | Status: DC
Start: 2016-12-22 — End: 2016-12-23

## 2016-12-22 MED ORDER — ONDANSETRON HCL (PF) 4 MG/2 ML IJ SOLN
INTRAVENOUS | 0 refills | Status: DC
Start: 2016-12-22 — End: 2016-12-22
  Administered 2016-12-22: 23:00:00 4 mg via INTRAVENOUS

## 2016-12-22 MED ORDER — FLUTICASONE 50 MCG/ACTUATION NA SPSN
1 | Freq: Two times a day (BID) | NASAL | 0 refills | Status: CN | PRN
Start: 2016-12-22 — End: ?

## 2016-12-22 MED ORDER — ATORVASTATIN 40 MG PO TAB
20 mg | Freq: Every evening | ORAL | 0 refills | Status: CN
Start: 2016-12-22 — End: ?

## 2016-12-22 MED ORDER — HALOPERIDOL LACTATE 5 MG/ML IJ SOLN
1 mg | Freq: Once | INTRAVENOUS | 0 refills | Status: CP | PRN
Start: 2016-12-22 — End: ?

## 2016-12-22 MED ORDER — PHENYLEPHRINE IN 0.9% NACL(PF) 1 MG/10 ML (100 MCG/ML) IV SYRG
INTRAVENOUS | 0 refills | Status: DC
Start: 2016-12-22 — End: 2016-12-22
  Administered 2016-12-22 (×2): 100 ug via INTRAVENOUS

## 2016-12-22 MED ORDER — OXYCODONE 5 MG PO TAB
5-10 mg | ORAL_TABLET | ORAL | 0 refills | 6.00000 days | Status: AC | PRN
Start: 2016-12-22 — End: 2017-02-10
  Filled 2016-12-23 (×2): qty 15, 2d supply, fill #1

## 2016-12-22 MED ORDER — ACETAMINOPHEN 325 MG PO TAB
650 mg | ORAL | 0 refills | Status: CN | PRN
Start: 2016-12-22 — End: ?

## 2016-12-22 MED ORDER — SITAGLIPTIN 100 MG PO TAB
100 mg | Freq: Every day | ORAL | 0 refills | Status: CN
Start: 2016-12-22 — End: ?

## 2016-12-22 MED ORDER — VANCOMYCIN 1,500 MG IVPB
1500 mg | Freq: Two times a day (BID) | INTRAVENOUS | 0 refills | Status: DC
Start: 2016-12-22 — End: 2016-12-23

## 2016-12-22 MED ORDER — ACETAMINOPHEN 500 MG PO TAB
1000 mg | Freq: Three times a day (TID) | ORAL | 0 refills | Status: CN
Start: 2016-12-22 — End: ?

## 2016-12-22 MED ORDER — PROPOFOL INJ 10 MG/ML IV VIAL
0 refills | Status: DC
Start: 2016-12-22 — End: 2016-12-22
  Administered 2016-12-22: 22:00:00 100 mg via INTRAVENOUS
  Administered 2016-12-22 (×2): 50 mg via INTRAVENOUS
  Administered 2016-12-22: 22:00:00 200 mg via INTRAVENOUS

## 2016-12-22 MED ORDER — LIDOCAINE (PF) 10 MG/ML (1 %) IJ SOLN
.1-2 mL | INTRAMUSCULAR | 0 refills | Status: DC | PRN
Start: 2016-12-22 — End: 2016-12-23

## 2016-12-22 MED ORDER — HYDROMORPHONE (PF) 2 MG/ML IJ SYRG
1 mg | INTRAVENOUS | 0 refills | Status: DC | PRN
Start: 2016-12-22 — End: 2016-12-23

## 2016-12-22 MED ORDER — FENTANYL CITRATE (PF) 50 MCG/ML IJ SOLN
0 refills | Status: DC
Start: 2016-12-22 — End: 2016-12-22
  Administered 2016-12-22 (×2): 50 ug via INTRAVENOUS
  Administered 2016-12-22: 22:00:00 100 ug via INTRAVENOUS

## 2016-12-22 MED ORDER — PANTOPRAZOLE 40 MG PO TBEC
40 mg | Freq: Two times a day (BID) | ORAL | 0 refills | Status: CN
Start: 2016-12-22 — End: ?

## 2016-12-22 MED ORDER — VENLAFAXINE 75 MG PO CP24
75 mg | Freq: Every day | ORAL | 0 refills | Status: CN
Start: 2016-12-22 — End: ?

## 2016-12-22 MED ORDER — ONDANSETRON HCL (PF) 4 MG/2 ML IJ SOLN
4 mg | INTRAVENOUS | 0 refills | Status: CN | PRN
Start: 2016-12-22 — End: ?

## 2016-12-22 MED ORDER — LORATADINE 10 MG PO TAB
10 mg | Freq: Every day | ORAL | 0 refills | Status: CN
Start: 2016-12-22 — End: ?

## 2016-12-22 MED ORDER — LIDOCAINE (PF) 200 MG/10 ML (2 %) IJ SYRG
0 refills | Status: DC
Start: 2016-12-22 — End: 2016-12-22
  Administered 2016-12-22: 22:00:00 140 mg via INTRAVENOUS

## 2016-12-22 MED ORDER — ESTRADIOL 1 MG PO TAB
1 mg | Freq: Every evening | ORAL | 0 refills | Status: CN
Start: 2016-12-22 — End: ?

## 2016-12-22 MED ORDER — ROPIVACAINE (PF) 5 MG/ML (0.5 %) IJ SOLN
0 refills | Status: DC
Start: 2016-12-22 — End: 2016-12-23
  Administered 2016-12-23: 30 mL

## 2016-12-22 MED ORDER — VANCOMYCIN 1,000 MG IV SOLR
0 refills | Status: DC
Start: 2016-12-22 — End: 2016-12-23
  Administered 2016-12-22: 23:00:00 1 g via TOPICAL

## 2016-12-22 MED ORDER — DEXAMETHASONE SODIUM PHOSPHATE 4 MG/ML IJ SOLN
INTRAVENOUS | 0 refills | Status: DC
Start: 2016-12-22 — End: 2016-12-22
  Administered 2016-12-22: 23:00:00 10 mg via INTRAVENOUS

## 2016-12-22 MED ORDER — MIDAZOLAM 1 MG/ML IJ SOLN
INTRAVENOUS | 0 refills | Status: DC
Start: 2016-12-22 — End: 2016-12-22
  Administered 2016-12-22: 22:00:00 2 mg via INTRAVENOUS

## 2016-12-22 MED ORDER — CEFAZOLIN 1 GRAM IJ SOLR
0 refills | Status: DC
Start: 2016-12-22 — End: 2016-12-22
  Administered 2016-12-22: 23:00:00 3 g via INTRAVENOUS

## 2016-12-22 MED ORDER — SUCCINYLCHOLINE CHLORIDE 20 MG/ML IJ SOLN
INTRAVENOUS | 0 refills | Status: DC
Start: 2016-12-22 — End: 2016-12-22
  Administered 2016-12-22: 22:00:00 140 mg via INTRAVENOUS

## 2016-12-22 MED ORDER — DIPHENHYDRAMINE HCL 50 MG/ML IJ SOLN
25 mg | INTRAVENOUS | 0 refills | Status: CN | PRN
Start: 2016-12-22 — End: ?

## 2016-12-22 MED ORDER — SUGAMMADEX 100 MG/ML IV SOLN
INTRAVENOUS | 0 refills | Status: DC
Start: 2016-12-22 — End: 2016-12-22
  Administered 2016-12-22: 23:00:00 280 mg via INTRAVENOUS

## 2016-12-22 MED ORDER — ACETAMINOPHEN 650 MG RE SUPP
650 mg | RECTAL | 0 refills | Status: CN | PRN
Start: 2016-12-22 — End: ?

## 2016-12-22 MED ORDER — PROMETHAZINE 25 MG/ML IJ SOLN
6.25 mg | INTRAVENOUS | 0 refills | Status: DC | PRN
Start: 2016-12-22 — End: 2016-12-23

## 2016-12-22 MED ORDER — BISACODYL 10 MG RE SUPP
10 mg | Freq: Every day | RECTAL | 0 refills | Status: CN | PRN
Start: 2016-12-22 — End: ?

## 2016-12-22 MED ORDER — MEPERIDINE (PF) 25 MG/ML IJ SYRG
12.5 mg | INTRAVENOUS | 0 refills | Status: DC | PRN
Start: 2016-12-22 — End: 2016-12-23

## 2016-12-22 MED ORDER — METFORMIN 500 MG PO TAB
500 mg | Freq: Two times a day (BID) | ORAL | 0 refills | Status: CN
Start: 2016-12-22 — End: ?

## 2016-12-22 MED ORDER — FENTANYL CITRATE (PF) 50 MCG/ML IJ SOLN
50 ug | INTRAVENOUS | 0 refills | Status: DC | PRN
Start: 2016-12-22 — End: 2016-12-23
  Administered 2016-12-22 (×2): 50 ug via INTRAVENOUS

## 2016-12-22 MED ORDER — OXYCODONE 5 MG PO TAB
5-10 mg | ORAL | 0 refills | Status: CN | PRN
Start: 2016-12-22 — End: ?

## 2016-12-22 MED ORDER — OXYCODONE 5 MG PO TAB
5-10 mg | Freq: Once | ORAL | 0 refills | Status: DC | PRN
Start: 2016-12-22 — End: 2016-12-23

## 2016-12-22 MED ORDER — MAGNESIUM HYDROXIDE 2,400 MG/10 ML PO SUSP
10 mL | Freq: Every day | ORAL | 0 refills | Status: CN
Start: 2016-12-22 — End: ?

## 2016-12-22 MED ORDER — LACTATED RINGERS IV SOLP
1000 mL | INTRAVENOUS | 0 refills | Status: DC
Start: 2016-12-22 — End: 2016-12-23
  Administered 2016-12-22: 23:00:00 1000.000 mL via INTRAVENOUS

## 2016-12-22 MED ORDER — VANCOMYCIN PHARMACY TO MANAGE
1 | 0 refills | Status: DC
Start: 2016-12-22 — End: 2016-12-23

## 2016-12-22 MED ORDER — DIPHENHYDRAMINE HCL 25 MG PO CAP
25 mg | ORAL | 0 refills | Status: CN | PRN
Start: 2016-12-22 — End: ?

## 2016-12-22 MED ORDER — LIDOCAINE (PF) 10 MG/ML (1 %) IJ SOLN
0 refills | Status: DC
Start: 2016-12-22 — End: 2016-12-23
  Administered 2016-12-22: 4 mL

## 2016-12-22 MED ORDER — FENTANYL CITRATE (PF) 50 MCG/ML IJ SOLN
25 ug | INTRAVENOUS | 0 refills | Status: DC | PRN
Start: 2016-12-22 — End: 2016-12-23

## 2016-12-22 MED ORDER — DEXTRAN 70-HYPROMELLOSE (PF) 0.1-0.3 % OP DPET
0 refills | Status: DC
Start: 2016-12-22 — End: 2016-12-22
  Administered 2016-12-22: 22:00:00 2 [drp] via OPHTHALMIC

## 2016-12-22 MED ADMIN — HALOPERIDOL LACTATE 5 MG/ML IJ SOLN [3584]: 1 mg | INTRAVENOUS | Stop: 2016-12-22 | NDC 67457042600

## 2016-12-22 MED ADMIN — LACTATED RINGERS IV SOLP [4318]: 1000 mL | INTRAVENOUS | @ 20:00:00 | Stop: 2016-12-22 | NDC 00338011704

## 2016-12-22 NOTE — Anesthesia Pre-Procedure Evaluation
Anesthesia Pre-Procedure Evaluation    Name: Joyce Mcdowell      MRN: [id number]     DOB: [birth year]     Age: 51 y.o.     Sex: female   __________________________________________________________________________     Procedure Date: 12/22/2016 06/30/16  Procedure: Procedure(s) with comments:  INCISION AND DRAINAGE FOOT - CASE LENGTH 30 MINUTES  WOUND VAC PLACEMENT     Physical Assessment  Vital Signs (last filed in past 24 hours):  BP: 140/95 (09/24 1430)  Temp: 37.1 ???C (98.8 ???F) (09/24 1430)  Pulse: 95 (09/24 1430)  Respirations: 21 PER MINUTE (09/24 1430)  SpO2: 97 % (09/24 1430)  O2 Delivery: None (Room Air) (09/24 1430)  Height: 180.3 cm (71) (09/24 1430)  Weight: 142.3 kg (313 lb 11.4 oz) (09/24 1430)      Patient History  Allergies   Allergen Reactions   ??? Adhesive Tape (Rosins) BLISTERS        Current Medications    Medication Directions   acetaminophen (TYLENOL) 500 mg tablet Take 1,000 mg by mouth three times daily. Max of 4,000 mg of acetaminophen in 24 hours.   aspirin EC 81 mg tablet Take 81 mg by mouth at bedtime daily. Take with food.   atorvastatin (LIPITOR) 20 mg tablet TAKE 1 TABLET BY MOUTH DAILY AT BEDTIME   celecoxib (CELEBREX) 200 mg capsule Take 200 mg by mouth twice daily.   cephalexin (KEFLEX) 500 mg capsule Take one capsule by mouth four times daily for 10 days.   dapagliflozin 10 mg tab Take 1 tablet by mouth daily.   docusate (COLACE) 100 mg capsule Take 1 capsule by mouth twice daily.   estradiol (ESTRACE) 1 mg tablet Take 1 mg by mouth at bedtime daily.   fexofenadine(+) (ALLEGRA) 180 mg tablet Take 180 mg by mouth daily.   fluticasone (FLONASE) 50 mcg/actuation nasal spray Apply 1 spray to each nostril as directed twice daily as needed. Shake bottle gently before using.   levothyroxine (SYNTHROID) 125 mcg tablet TAKE 1 TABLET BY MOUTH DAILY   lisinopril (PRINIVIL; ZESTRIL) 20 mg tablet TAKE 1 TABLET BY MOUTH EVERY NIGHT metFORMIN (GLUCOPHAGE) 500 mg tablet TAKE 1 TABLET BY MOUTH TWICE DAILY   other medication Take 1 Dose by mouth. Medication Name & Strength: Caniboid Oil  Dose(how many): Unknown Frequency(how often): Twice Daily Sublingual   oxyCODONE (ROXICODONE, OXY-IR) 5 mg tablet Take 1-2 tablets by mouth every 4 hours as needed   pantoprazole DR (PROTONIX) 40 mg tablet TAKE 1 TABLET BY MOUTH TWICE DAILY   sitaGLIPtin (JANUVIA) 100 mg tab tablet Take 1 tablet by mouth daily.   venlafaxine XR (EFFEXOR XR) 75 mg capsule Take 75 mg by mouth daily.   vitamins, multiple tablet Take 1 tablet by mouth daily.         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    Abdominal Findings:       Obese    Neurological Findings:       Normal mental status      Not sedated       Diagnostic Tests  Hematology:   Lab Results   Component Value Date    HGB 11.7 11/03/2016    HCT 36.0 11/03/2016    PLTCT 179 11/03/2016    WBC 13.0 11/03/2016    MCV 90.5 11/03/2016    MCH 29.3 11/03/2016    MCHC 32.4 11/03/2016    MPV 9.7 11/03/2016    RDW 14.3 11/03/2016         General Chemistry:   Lab Results   Component Value Date    NA 141 11/03/2016    K 3.9 11/03/2016    CL 104 11/03/2016    CO2 31 11/03/2016    GAP 6 11/03/2016    BUN 13 11/03/2016    CR 0.71 11/03/2016    GLU 114 11/03/2016    CA 9.1 11/03/2016      Coagulation: No results found for: PT, PTT, INR      Anesthesia Plan    ASA score: 2   Plan: general and regional for postoperative pain  Induction method: intravenous  NPO status: acceptable      Informed Consent  Anesthetic plan and risks discussed with patient.  Use of blood products discussed with patient      Plan discussed with: anesthesiologist and resident.  Comments: (Patient reports having dental problems after anesthesia on multiple occasions. Her implant fell out 10/31/2016 after having anesthesia.)     Labs: cbc, bmp  Consults:  None

## 2016-12-22 NOTE — H&P (View-Only)
Allergies   Allergen Reactions   ??? Adhesive Tape (Rosins) BLISTERS        I have assessed the patient, and there are no significant changes in their condition from the previous clinic visit.    - pt to OR for I&D and wound vac   - risks and benefits d/w pt. Questions answered.   - consent on chart     Bufford Buttner, MD   Pager [phone]     Orthopedic History & Physical Note      Admission Date: 12/22/2016                                                  Chief Complaint:  L ankle wound    History of Present Illness: Joyce Mcdowell is a 51 y.o. female  She is here today for postoperative follow up wound check.  She states that she has had persistent drainage, but on Monday it flared up and started bleeding.  On exam her wound is immature and draining.  It measures 3x2x2 today.  I am going to culture her wound today.  I have taken out the remaining sutures.  I want to get an MRI scan of this with and without constrast.  I think that she is likely going to require an I&D of this wound.  I would also like to get a wound VAC ordered for this.    Review of Systems:  10 Point Review of Systems Obtained. Pertinent items noted in HPI.       PMH -   Past Medical History:   Diagnosis Date   ??? Arthritis    ??? DM (diabetes mellitus) (HCC)    ??? Gastrointestinal disorder     gerd   ??? Hyperlipidemia    ??? Hypertension    ??? Osteoarthritis    ??? Psychiatric illness     situational depression   ??? Thyroid disease        PSH -   Past Surgical History:   Procedure Laterality Date   ??? CRUCIATE AND COLLATERAL LIGAMENT REPAIR Left 06/30/2016    RECONSTRUCTION LIGAMENT COLLATERAL LOWER EXTREMITY performed by Ree Shay, MD [facility]   ??? LEG TENDON SURGERY Left 06/30/2016    SECONDARY PERINEAL TENDON REPAIR performed by Ree Shay, MD [facility]   ??? INCISION AND DRAINAGE Left 10/31/2016    INCISION AND DRAINAGE OF LEFT ANKLE. performed by Ree Shay, MD [facility]   ??? HARDWARE REMOVAL Left 10/31/2016 REMOVAL HARDWARE -DEEP performed by Ree Shay, MD [facility]   ??? LAYER WOUND CLOSURE Left 11/02/2016    REPEAT LEFT ANKLE IRRIGATION AND DEBRIDEMENT WITH SECONDARY CLOSURE performed by Ree Shay, MD [facility]   ??? ABDOMEN SURGERY      ruptured absess that was attached to ovary   ??? FOOT NEUROMA SURGERY Left    ??? HX APPENDECTOMY     ??? HX ARTHROSCOPIC SURGERY Bilateral     4 total   ??? HX CESAREAN SECTION     ??? HX CHOLECYSTECTOMY     ??? HX HYSTERECTOMY      with absess surgery   ??? HX WRIST FRACTURE SURGERY     ??? KNEE SURGERY Right    ??? SHOULDER SURGERY Bilateral     r- cleaned out and impingement, r-tendon repair,  l cleaned out and impengement       SoHx -   Social History     Social History   ??? Marital status: Married     Spouse name: N/A   ??? Number of children: N/A   ??? Years of education: N/A     Occupational History   ??? Not on file.     Social History Main Topics   ??? Smoking status: Never Smoker   ??? Smokeless tobacco: Never Used   ??? Alcohol use 1.2 oz/week     2 Shots of liquor per week      Comment: occasional   ??? Drug use: No   ??? Sexual activity: Not on file     Other Topics Concern   ??? Not on file     Social History Narrative   ??? No narrative on file       FamHx - noncontributory to current illness, except as noted in HPI  Meds - please refer to medical record  Allergies:  Adhesive tape (rosins)    Physical Exam:    Blood pressure (!) 140/95, pulse 95, temperature 37.1 ???C (98.8 ???F), height 180.3 cm (71), weight (!) 142.3 kg (313 lb 11.4 oz), SpO2 97 %.    Constitutional: AAOx3, NAD  HEENT: NC/NT  Respiratory: Unlabored respirations  Cardiovascular: RRR  Abdomen: ND  Skin: wound to left ankle  Musculoskeletal: silt grossly intact, some numbness      Lab/Radiology/Other Diagnostic Tests:  CBC w/Diff   Lab Results   Component Value Date/Time    WBC 13.0 (H) 11/03/2016 04:30 AM    HGB 11.7 (L) 11/03/2016 04:30 AM    HCT 36.0 11/03/2016 04:30 AM    PLTCT 179 11/03/2016 04:30 AM Inflammatory Markers   No results found for: ESR, CRP     Coagulation Studies   No results found for: PT, PTT, INR     Basic Metabolic Profile   Lab Results   Component Value Date/Time    NA 141 11/03/2016 04:30 AM    K 3.9 11/03/2016 04:30 AM    CL 104 11/03/2016 04:30 AM    CO2 31 (H) 11/03/2016 04:30 AM    GAP 6 11/03/2016 04:30 AM    BUN 13 11/03/2016 04:30 AM    CR 0.71 11/03/2016 04:30 AM    GLU 114 (H) 11/03/2016 04:30 AM        Radiology: IMPRESSION      1. ???Lateral ankle wound with signal abnormality within the lateral soft   tissues of the ankle which may represent postsurgical change or phlegmon.   A thin tract of hypoenhancing tissue courses anteriorly from the lateral   ankle wound, compatible with a represent sinus tract or devitalized soft   tissue. No discrete soft tissue abscess.     2. ???Regions of geographic T1 signal hypointensity with corresponding edema   and enhancement in the anterior tibial plafond and lateral malleolus,   which could represent focal osteomyelitis or may be post surgical in   etiology.    3. 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    4. ???Tibiotalar joint synovitis    5. ???Progression of joint centered bone marrow edema within the lateral   talus and medial tibial plafond, favored to be degenerative in etiology.      ???Approved by Royal Hawthorn, MD on 12/19/2016 11:36 AM    Assessment Joyce Mcdowell is a 51 y.o. female L ankle wound

## 2016-12-22 NOTE — Other
Brief Operative Note    Name: Yaritzel Stange is a 51 y.o. female     DOB: [birth year]             MRN#: [id number]  DATE OF OPERATION: 12/22/2016    Date:  12/22/2016        Preoperative Dx:   Puncture wound of foot, left, complicated, subsequent encounter [S91.332D]    Post-op Diagnosis      * Puncture wound of foot, left, complicated, subsequent encounter [S91.332D]    Procedure(s) (LRB):  INCISION AND DRAINAGE FOOT (Left)  WOUND VAC PLACEMENT (Left)    Anesthesia Type: General    Surgeon(s) and Role:     * Frances Maywood, MD - Resident - Assisting     * Ree Shay, MD - Primary      Findings:  Left lateral ankle wound. No purulent drainage. Communicated directly with subtalar joint.    Estimated Blood Loss: No blood loss documented.     Specimen(s) Removed/Disposition:   ID Type Source Tests Collected by Time Destination   A : LEFT ANKLE WOUND FOR CULTURES Tissue Ankle CULTURE-ANAEROBIC, CULTURE-WOUND/TISSUE/FLUID(AEROBIC ONLY)W/SENSITIVITY, CULTURE-TB (AFB), Ardis Rowan, CULTURE-FUNGAL,OTHER Ree Shay, MD 12/22/2016 1746        Complications:  None    Implants: None    Drains: Other Wound vac - 5    Disposition:  PACU - stable    Frances Maywood, MD  Pager

## 2016-12-22 NOTE — Consults
Infectious Diseases Initial Consult    Today's Date:  12/22/2016  Admission Date: 12/22/2016    Reason for this consultation: osteomyelitis  Ordering Provider's or Responsible Teams's Pager #? [phone]    Consult Type: Co-Management w/Signed Orders      Assessment:     Active Problems:    * No active hospital problems. *  Left ankle wound infection  Post surgical repair of lateral ligaments with torn peroneus brevis  Post op hematoma with skin breakdown  Draining wound   Polymicrobial bacterial infection    Recommendations:     Antibiotic indications  Left ankle draining wound  Putative osteomyelitis, but may all be soft tissue infection  Post left ankle lateral ligamentous repair in April 2018  Synovial fluid  leaking reportedly at [facility] wound care clinic  Reduction in drainage when placed on oral cephalexin one week ago    Microbes isolated  07/22/2016 moderate Staphylococcus coagulase-negative,   no fungal growth, no anaerobic growth.  10/28/2016 left lateral ankle Corynebacterium species light growth,   no anaerobes  10/31/2016 left ankle, Corynebacterium pseudodiphtheriticum  Susceptibility ([facility])   12/18/16 Left foot/ankle wound Peptostreptococcus spp  Gram stain showed GPR ~ diphtheroids that did not grow    Antibiotic selected  Favor using IV as she has had such a prolonged course and relapses  After cultures are taken in OR, would begin empiric selection based on failure to clear the infection so far:  Vancomycin 1.5 gm every 12 hrs  Ertapenem 1.5 gm every 24 hrs    Start dates, projected duration, stop dates  Favor treating this for several weeks much like we would for osteomyelitis even though osteo may not be present.    Monitor  CBC ESR CMP  Vancomycin troughs of 10-15 since we do not have any staph susceptibility studies    Other        Antimicrobial Start date Projected duration & stop date End date for antibiotics stopped already CrCl cannot be calculated (Patient's most recent lab result is older than the maximum 30 days allowed.).    History of Present Illness    Joyce Mcdowell is a 51 y.o.   Patient said that the ankle wound has continued to drain throughout and never really stopped.  It was causing much of a problem until about a week ago.  She then began having low-grade fever to 100.5, and felt sick.  The wound was red surrounding it and was draining more.  It did not smell foul.  She had been off antibiotics for a few weeks.  Dr. Wilkie Aye had seen her in the clinic and had taken the culture that had shown Peptostreptococcus.  He placed her on oral cephalexin 500 mg 4 times daily, and the fever went away, and she no longer felt tired and sick.  However the drainage has continued.  The amount has not been great, and has not looks greatly purulent.    In addition she said that the right knee, the knee on the other side, but was causing more of a problem.  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  She has been favoring the left foot and ankle because of the wound and that is probably contributed to more stress on the right knee.    She denies having any other problems.  She currently is having no fever, sweats, night sweats, nausea, vomiting, abdominal pain, chest pain, skin rashes, or other issues.  She called this  morning to tell Dr. Wilkie Aye was happening and he saw her, admit her, and she is planned for I&D today.  Infectious diseases consulted to evaluate, monitor the progress, and help to select antimicrobial therapy for this lady who has had continued and recurrent problems despite surgical repairs and oral antibiotics.     Recent History:  Patient is from [place], [place], who was seen the 20 2018 in the foot orthopedic clinic by Dr. Wilkie Aye.  She was coming for evaluation of the left ankle that been injured after he stepped in a ditch in a fall.  She had been seen previously, placed in a cam walker, but unable to get out of the Cam because of pain and swelling.  She had a history of injuries of the ankle as a child, has had problems with the knee.  An MRI showed the lateral ligaments were torn, she had a complex tear of the peroneus brevis. On 06/30/2016, she underwent secondary repair of the left peroneus brevis tendon and lateral ankle ligament reconstruction using a modified Brostr???m technique.  She did have a hematoma postoperatively that was drained and seem to be resolving well.  Wound was recultured. She was seen again on 10/28/2016, to need to have a wound in the area where the previous hematoma had been but was doing okay with local wound care.  The assessment was that the wound was not likely he will without being surgically repaired.  ???  Procedure on 10/31/2016:  Post-op Diagnosis   Ankle wound, left, subsequent encounter [S91.002D]  Procedure  INCISION AND DRAINAGE OF LEFT ANKLE. (Left)  WOUND VAC PALCEMENT LEFT ANKLE (Left)  REMOVAL HARDWARE -DEEP (Left)    Cultures show the following:  07/22/2016 moderate Staphylococcus coagulase-negative,   no fungal growth, no anaerobic growth.  10/28/2016 left lateral ankle Corynebacterium species light growth,   no anaerobes  10/31/2016 left ankle, Corynebacterium pseudodiphtheriticum  Susceptibility ([facility])   12/18/16 Left foot/ankle wound Peptostreptococcus spp  Gram stain showed GPR ~ diphtheroids that did not grow        Antibiotics used at the 10/31/16 visit:  Cefazolin 10/31/16 to 11/03/16  Intra-operatively, used powdered vancomycin in the wound  Doxycycline 100 mg po BID 11/03/16 to 11/17/16  Levofloxacin 750 mg po daily 8/6 to 11/17/16           Past Medical History     Past Medical History:   Diagnosis Date   ??? Arthritis    ??? DM (diabetes mellitus) (HCC)    ??? Gastrointestinal disorder     gerd   ??? Hyperlipidemia    ??? Hypertension    ??? Osteoarthritis    ??? Psychiatric illness     situational depression   ??? Thyroid disease        Past Surgical History     Past Surgical History: Procedure Laterality Date   ??? CRUCIATE AND COLLATERAL LIGAMENT REPAIR Left 06/30/2016    RECONSTRUCTION LIGAMENT COLLATERAL LOWER EXTREMITY performed by Ree Shay, MD [facility]   ??? LEG TENDON SURGERY Left 06/30/2016    SECONDARY PERINEAL TENDON REPAIR performed by Ree Shay, MD at [facility]   ??? INCISION AND DRAINAGE Left 10/31/2016    INCISION AND DRAINAGE OF LEFT ANKLE. performed by Ree Shay, MD [facility]   ??? HARDWARE REMOVAL Left 10/31/2016    REMOVAL HARDWARE -DEEP performed by Ree Shay, MD [facility]   ??? LAYER WOUND CLOSURE Left 11/02/2016    REPEAT LEFT ANKLE IRRIGATION AND  DEBRIDEMENT WITH SECONDARY CLOSURE performed by Ree Shay, MD at [facility]   ??? ABDOMEN SURGERY      ruptured absess that was attached to ovary   ??? FOOT NEUROMA SURGERY Left    ??? HX APPENDECTOMY     ??? HX ARTHROSCOPIC SURGERY Bilateral     4 total   ??? HX CESAREAN SECTION     ??? HX CHOLECYSTECTOMY     ??? HX HYSTERECTOMY      with absess surgery   ??? HX WRIST FRACTURE SURGERY     ??? KNEE SURGERY Right    ??? SHOULDER SURGERY Bilateral     r- cleaned out and impingement, r-tendon repair, l cleaned out and impengement       Social History     Social History   Substance Use Topics   ??? Smoking status: Never Smoker   ??? Smokeless tobacco: Never Used   ??? Alcohol use 1.2 oz/week     2 Shots of liquor per week      Comment: occasional       Family History   FH of DM, cancer and heart disease    Medications   Scheduled Meds: Continuous Infusions:  ??? lactated ringers infusion 1,000 mL (12/22/16 1445)     PRN and Respiratory Meds:lidocaine PF PRN      Allergies     Allergies   Allergen Reactions   ??? Adhesive Tape (Rosins) BLISTERS       Review of Systems     A comprehensive 14-point review of systems was negative with exception of those items listed above in the history. Others are noted below.      Physical Examination                          Vital Signs: Last                  Vital Signs: 24 Hour Range BP: 140/95 (09/24 1430)  Temp: 37.1 ???C (98.8 ???F) (09/24 1430)  Pulse: 95 (09/24 1430)  Respirations: 21 PER MINUTE (09/24 1430)  SpO2: 97 % (09/24 1430)  O2 Delivery: None (Room Air) (09/24 1430)  SpO2 Pulse: 96 (09/24 1430)  Height: 180.3 cm (71) (09/24 1430) BP: (140)/(95)   Temp:  [37.1 ???C (98.8 ???F)]   Pulse:  [95]   Respirations:  [21 PER MINUTE]   SpO2:  [97 %]   O2 Delivery: None (Room Air)     General appearance: alert, oriented, NAD  HEENT: Conj nl, PERRL, EOM grossly intact,   mucus membranes moist, no oral lesions/thrush  Neck: supple, no lymphadenopathy, thyroid not palpable  Lungs: no wheezing, rhonchi, rales appreciated  Heart: Regular rhythm, reg rate, with no murmur   Abdomen: soft, non-tender, non-distended, normal bowel sounds,   no hepatosplenomegaly, no masses  Ext:  No clubbing, cyanosis or edema  Lateral aspect of the left ankle has a 2.5 cm x 0.4 cm ulcer, with drainage.  The drainage is not fetid, but is scanty and yellow white.  Skin: no rashes/lesions  Lymph: no cervical, axillary or inguinal adenopathy  Neuro: moves extremities,     Lines:    Lab Review   Hematology  No results for input(s): WBC, HGB, HCT, PLTCT, PTT, INR in the last 72 hours.  Chemistry  No results for input(s): NA, K, CL, CO2, BUN, CR, GFR, GLU, CA, PO4, ALBUMIN, TOTPROTEIN, ALKPHOS, AST, ALT, TOTBILI in the last 72 hours.  Invalid input(s): MAG    Microbiology, Radiology and other Diagnostics Review   Microbiology data reviewed.          Pertinent radiology images viewed.  Impression:                 Cleophus Molt, MD   Division of Infectious Diseases       12/22/2016

## 2016-12-23 ENCOUNTER — Encounter: Admit: 2016-12-23 | Discharge: 2016-12-23 | Payer: BC Managed Care – PPO

## 2016-12-23 DIAGNOSIS — K219 Gastro-esophageal reflux disease without esophagitis: ICD-10-CM

## 2016-12-23 DIAGNOSIS — M1990 Unspecified osteoarthritis, unspecified site: ICD-10-CM

## 2016-12-23 DIAGNOSIS — E079 Disorder of thyroid, unspecified: ICD-10-CM

## 2016-12-23 DIAGNOSIS — T8189XA Other complications of procedures, not elsewhere classified, initial encounter: Principal | ICD-10-CM

## 2016-12-23 DIAGNOSIS — S91002A Unspecified open wound, left ankle, initial encounter: ICD-10-CM

## 2016-12-23 DIAGNOSIS — Z833 Family history of diabetes mellitus: ICD-10-CM

## 2016-12-23 DIAGNOSIS — E785 Hyperlipidemia, unspecified: ICD-10-CM

## 2016-12-23 DIAGNOSIS — E119 Type 2 diabetes mellitus without complications: ICD-10-CM

## 2016-12-23 DIAGNOSIS — I10 Essential (primary) hypertension: ICD-10-CM

## 2016-12-23 DIAGNOSIS — T8140XS Infection following a procedure, unspecified, sequela: Principal | ICD-10-CM

## 2016-12-23 LAB — CULTURE-ANAEROBIC

## 2016-12-23 LAB — GRAM STAIN

## 2016-12-23 NOTE — Anesthesia Post-Procedure Evaluation
Post-Anesthesia Evaluation    Name: Joyce Mcdowell      MRN: [id number]     DOB: July 14, 1965     Age: 51 y.o.     Sex: female   __________________________________________________________________________     Procedure Date: 12/22/2016  Procedure: Procedure(s) with comments:  INCISION AND DRAINAGE FOOT - CASE LENGTH 30 MINUTES  WOUND VAC PLACEMENT      Surgeon: Surgeon(s):  Ree Shay, MD  Frances Maywood, MD    Post-Anesthesia Vitals  BP: 139/76 (09/24 1915)  Pulse: 101 (09/24 1915)  Respirations: 14 PER MINUTE (09/24 1915)  SpO2: 100 % (09/24 1915)  SpO2 Pulse: 101 (09/24 1915)      Post Anesthesia Evaluation Note    Evaluation location: Pre/Post  Patient participation: recovered; patient participated in evaluation  Level of consciousness: alert    Pain score: 2  Pain management: adequate    Hydration: normovolemia  Temperature: 36.0C - 38.4C  Airway patency: adequate    Regional/Neuraxial:       Neurological status: sensory deficit      Single injection shot performed    Perioperative Events  Perioperative events:  no       Post-op nausea and vomiting: nausea; resolved    Postoperative Status  Cardiovascular status: hemodynamically stable  Respiratory status: spontaneous ventilation  Follow-up needed: none        Perioperative Events  Perioperative Event: No  Emergency Case Activation: No

## 2016-12-23 NOTE — Progress Notes
Call from [facility] RN, in ortho, this am stating that pt was discharged from PACU yesterday evening, did not want to wait for PICC placement or IV abx to be started.  Joyce Mcdowell stated that orders were being sent to arrange OP services.  Later learned in afternoon, that ID was to set up PICC placement and first doses of abx.  Contacted pt to arrange these services and she stated that she was told that she could remain on oral bactrim until 10/2 appt and then have PICC placed and IV abx started at this time.  Reviewed w/ Dr. Willy Eddy who reached out to Dr. Wilkie Aye, awaiting to hear back from Dr. Wilkie Aye at this time.  Referral sent to [facility] to f/u on pt's coverage for IV erta and vancomycin.

## 2016-12-23 NOTE — Operative Report(Direct Entry)
OPERATIVE REPORT    Name: Joyce Mcdowell is a 51 y.o. female     DOB: 1965-05-02             MRN#: [id number]    DATE OF OPERATION: 12/22/2016    Surgeon(s) and Role:     * Ree Shay, MD - Primary     * Frances Maywood, MD - Resident - Assisting  Bufford Buttner MD - assisting        Preoperative Diagnosis:    1. Draining postoperative left ankle wound, non healing communicating with joint    Post-op Diagnosis    same    Procedures:    1. Excisional debridement of left foot wound  2. Application of wound vac    Anesthesia Type: General          Description and Findings of Operative Procedure:    Indications: Ms. Joyce Mcdowell is a 51 year old female who has had a complicated postoperative situation since a lateral ankle ligament reconstruction.  Last seen in clinic she had a wound on the left lateral ankle that probed deep and appeared to be infected.  This wound last seen had looked much improved.  At this point time we cultured the wound and ordered an MRI.  The MRI revealed extensive inflammation.  We recommended an incision and debridement of this wound with application of wound VAC with attempt to heal this from the inside out.  We also again start IV antibiotics.      Procedure: The patient was brought back to the preoperative holding area and signed and consented for the above procedure.  She was then brought back to the operative suite and underwent general anesthesia and then placed in a lateral position.  She had a tourniquet that was raised for less than an hour.  The patient was had a successful timeout identifying procedure and site.  She was then prepped and draped in the normal sterile fashion.    We started by taking a knife to open of the wound proximally and distally.  We then took a rongeur to remove the necrotic debris within the incision.  We also incised and debrided with a sharp knife.  We did note that the wound seemed to track to the subtalar articulation.  There was no obvious purulent fluid encountered although the tissue did appear to be compromised.  We debrided thoroughly the area which included fascial and subcutaneous tissue.  We then irrigated with 3 L of saline.  We then took an 0 PDS suture from the anterior portion of the ankle and then deep to bring together  the deep tissues.  We placed 2 of the sutures.  We then took a 3-0 PDS and closed the proximal and distal edges of the wound.  We then placed 1 g of vancomycin powder within the wound.  We then applied the wound VAC.  We used DuoDERM on the skin edges and then obtain adequate seal with the wound VAC.    Plan: We had a conversation with Ms. Joyce Mcdowell's husband that we recommended admitting to the hospital for IV antibiotics following cultures and obtaining a PICC line.  They did not want to be admitted so we prescribed oral antibiotics and will plan to obtain a PICC line antibiotic delivery methods and home health as an outpatient.        Estimated Blood Loss:  No blood loss documented.     Specimen(s) Removed/Disposition:   ID Type Source Tests Collected by  Time Destination   A : LEFT ANKLE WOUND FOR CULTURES Tissue Ankle CULTURE-ANAEROBIC, CULTURE-WOUND/TISSUE/FLUID(AEROBIC ONLY)W/SENSITIVITY, CULTURE-TB (AFB), Ardis Rowan, CULTURE-FUNGAL,OTHER Ree Shay, MD 12/22/2016 1746        Attestation: Dr. Wilkie Aye was present for all critical aspects of the case.    Geraldine Contras, MD  Pager [phone]

## 2016-12-24 ENCOUNTER — Encounter: Admit: 2016-12-24 | Discharge: 2016-12-24 | Payer: BC Managed Care – PPO

## 2016-12-24 DIAGNOSIS — K929 Disease of digestive system, unspecified: ICD-10-CM

## 2016-12-24 DIAGNOSIS — E079 Disorder of thyroid, unspecified: ICD-10-CM

## 2016-12-24 DIAGNOSIS — E785 Hyperlipidemia, unspecified: ICD-10-CM

## 2016-12-24 DIAGNOSIS — E119 Type 2 diabetes mellitus without complications: ICD-10-CM

## 2016-12-24 DIAGNOSIS — I10 Essential (primary) hypertension: ICD-10-CM

## 2016-12-24 DIAGNOSIS — F99 Mental disorder, not otherwise specified: ICD-10-CM

## 2016-12-24 DIAGNOSIS — M1990 Unspecified osteoarthritis, unspecified site: Principal | ICD-10-CM

## 2016-12-25 ENCOUNTER — Encounter: Admit: 2016-12-25 | Discharge: 2016-12-25 | Payer: BC Managed Care – PPO

## 2016-12-25 DIAGNOSIS — S91332S Puncture wound without foreign body, left foot, sequela: Principal | ICD-10-CM

## 2016-12-25 DIAGNOSIS — M868X7 Other osteomyelitis, ankle and foot: Principal | ICD-10-CM

## 2016-12-25 MED ORDER — VANCOMYCIN IVPB
1500 mg | INTRAVENOUS | 0 refills | 10.00000 days | Status: AC
Start: 2016-12-25 — End: 2016-12-30

## 2016-12-25 MED ORDER — ERTAPENEM IVPB
1500 mg | INTRAVENOUS | 0 refills | 5.00000 days | Status: AC
Start: 2016-12-25 — End: 2017-02-03

## 2016-12-25 NOTE — Telephone Encounter
12/24/16, Confirmed with Dr. Neita Carp he wants this patient started on IV antibiotics.  Discussed with patient, her first option would be [facility] in [place]. Marylu Lund, then [facility] in [place], or [place].     Left a message at [place] [facility], then was transferred to outpatient services which was closed.     Called [facility], they can arrange PICC placement and first doses for 12/25/16 at 1pm.     Discussed with patient, she would like to see if [facility] can do it, pt agreeable to wait until they open on 12/25/16.      12/25/16. Spoke with [place] at [facility], they agreed to see patient at 12:30pm check in for PICC placement, then will administer first doses of antibiotics. Orders faxed, left a voicemail with this information for the patient and asked her to call back if there are any problems.     Per Dr. Neita Carp,  1. Place Single lumen PICC, confirm placement per protocol.  2. Administer first doses of following antibiotics and monitor for any allergic reactions:  -Vancomycin 1.5g IV  -Ertapenem 1.5 g IV    3. Weekly PICC care per protocol & PRN.  4. Weekly labs to be started on Monday 12/29/16:  CBC&DIFF,  CMP,  ESR,  CRP,  Vancomycin trough    Fax results to [phone].     5. Call Beverlee Nims, Infectious Diseases RN at [phone] with any questions or concerns.       Orders faxed to [facility] as well. Confirmed with Abigail Butts at [facility].    Spoke with patient and confirmed orders and appointments. Pt is questioning about her wound care orders, will have ortho give patient a call in regards to that.     Verified with insurance patient does not need a prior authorization for PICC placement today. CPT code [id number].

## 2016-12-25 NOTE — Progress Notes
[facility]:    Date verified: 2017-01-07  Billable Codes: C5885, [id number], [place], [phone] and [phone]  Verification Source: Phone  Insurance Name: Gaye Pollack      Phone #: [phone]     Representative name: Cheron Schaumann      ID #: [id number]      Group #: [id number]  [facility] Name: Patient      Subscriber DOB: [id number]      Relation to Subscriber: Self  Effective Date: 1.1.17  Term Date:  Services Covered: Y   In-network    If not a provider, who is?    Deductible: $ 1500.00   Amount Met: $1500.00  OOP: $ 6350.00     Amount Met: $4073.28  Coinsurance rate: 54/65  After OOP, policy pays at: 681%      Calendar Year:  N  Fiscal Plan Year: Y  October 1st to October 1st  Type of Plan: PPO   Type of Policy :(Self-Funded / Fully [employer]): Fully  Is this a Marketplace Plan:  N  Is this a COBRA Plan: N  Medicare Replacement  or Supplemental: No  Plan network do they access: Central  What is their claim address:                    Copay/Infusion: $ N/A  Copay/ Nursing: $ N/A  Visit limit per Year: Medically Necessary  Yearly Lifetime Dollar Amount: $                                             Amount Met:  Homebound required for Infusion: N  Homebound required for Home Health Care: Y   Authorization Required: N      Phone #:      Fax #:  Pre-Determination Needed: N    Reference Number: [id number] LM    Patient will owe $464.81 weekly (20% until OOP max has been reached)  After 1st shipment policy year will start again and roll over to zero.  Patient will then owe $2324.09 for drug and supplies.  I called patient to inform her of this amount, and she may be interested in setting up a payment plan with [place].    Pt to receive PICC and first dose and teach  today at 1pm at [facility] in [place].  Patient will go outpatient for labs and dressing changes weekly here as well.    We will deliver package via [employer] to patient's home this afternoon/ evening.  Confirmed street address of [address], [place] [address]

## 2016-12-25 NOTE — Progress Notes
[facility] Note    Medication Delivery Date/Time/Place:  Medication & supplies delivered to pt home 9.27.18    Start of Care:  Home dose to begin 9.28.18    Drug:  Vancomycin 1.5 gm  Frequency: Q 24 hours  Adm Method: Elastomeric  Adm Directions:  Administer Vancomycin 1.5 gm (300 ml) IV every 24 hours. Dose will infuse over approximately 105 minutes via SMARTeZ device.    Drug: Invanz 1.5 gm  Frequency: Q 24 hours  Adm Method: Elastomeric  Adm Directions: Administer Invanz 1.5 gm (150 ml) IV every 24 hours.  Dose will infuse over approximately 45 minutes via SMARTeZ device.    Duration of therapy: unknown at [facility]    Access: PICC  Flush: NS 5-67m before and after each dose; Heparin 10 units/ml 5 ml as final flush/lock    Lab orders: CBC w/diff, CMP, ESR, CRP , Vancomycin trough Q week  Fax results to: Dr HNeita [phone]& KUHHI [phone]   HH: None - Outpatient at [phone]   MD: DCandise Che

## 2016-12-25 NOTE — Progress Notes
[facility]:    Received an update from my pharmacist that [facility] will not conduct a teach after 1pm PICC and first dose.    I contacted  [facility] Not wanting to go out for 1 visit  [facility]- No staffing  [facility]- No nursing till tuesday  [facility]: PCP out of the office  [facility]- pending    [facility] [phone] agreed to go out and see the patient tomorrow for home teach and administration    Patient will still continue to go to [facility] for weekly labs and dressing changes.

## 2016-12-25 NOTE — Telephone Encounter
Wound VAC orders faxed to [facility] in [place] [phone] fax, outpatient services [phone]). Lvm notifying pt as she is there right now for PICC line placement.

## 2016-12-27 LAB — CULTURE-ANAEROBIC

## 2016-12-27 LAB — CULTURE-WOUND/TISSUE/FLUID(AEROBIC ONLY)W/SENSITIVITY

## 2016-12-28 ENCOUNTER — Encounter: Admit: 2016-12-28 | Discharge: 2016-12-28 | Payer: BC Managed Care – PPO

## 2016-12-29 LAB — CREATININE: Lab: 0.6

## 2016-12-29 LAB — PLATELET COUNT: Lab: 211

## 2016-12-29 LAB — ALT (SGPT): Lab: 14 — ABNORMAL HIGH (ref 1.8–7.7)

## 2016-12-29 LAB — BUN: Lab: 13 — ABNORMAL LOW (ref 23–45)

## 2016-12-29 LAB — POTASSIUM: Lab: 3.8

## 2016-12-29 LAB — AST (SGOT): Lab: 16 — ABNORMAL HIGH (ref 33–66)

## 2016-12-29 LAB — ALK PHOS TOTAL: Lab: 108

## 2016-12-29 LAB — HEMOGLOBIN: Lab: 12

## 2016-12-29 LAB — VANCOMYCIN TROUGH

## 2016-12-29 LAB — SED RATE: Lab: 14

## 2016-12-29 LAB — C REACTIVE PROTEIN (CRP): Lab: 4.4 10*3/uL (ref 0–0.45)

## 2016-12-29 LAB — CBC: Lab: 9

## 2016-12-29 NOTE — Progress Notes
Received via MyChart message:  My appointment time is shown as 10:30 for a 30 minute appointment. My appointment with Dr. Wilkie Aye is at 12:20. Please change the time of my appointment with Dr. Willy Eddy to match that of the appointment with Dr. Wilkie Aye. Thank you    Messaged patient back informing her that Dr. Willy Eddy will be seeing her with Dr. Wilkie Aye at her ortho appt.  Please reach out to patient if this isnt correct.     Thanks  Angie

## 2016-12-30 ENCOUNTER — Encounter: Admit: 2016-12-30 | Discharge: 2016-12-30 | Payer: BC Managed Care – PPO

## 2016-12-30 ENCOUNTER — Ambulatory Visit: Admit: 2016-12-30 | Discharge: 2016-12-31 | Payer: BC Managed Care – PPO

## 2016-12-30 ENCOUNTER — Ambulatory Visit: Admit: 2016-12-30 | Discharge: 2016-12-30 | Payer: BC Managed Care – PPO

## 2016-12-30 DIAGNOSIS — E079 Disorder of thyroid, unspecified: ICD-10-CM

## 2016-12-30 DIAGNOSIS — M1990 Unspecified osteoarthritis, unspecified site: Secondary | ICD-10-CM

## 2016-12-30 DIAGNOSIS — E785 Hyperlipidemia, unspecified: ICD-10-CM

## 2016-12-30 DIAGNOSIS — K929 Disease of digestive system, unspecified: ICD-10-CM

## 2016-12-30 DIAGNOSIS — I10 Essential (primary) hypertension: ICD-10-CM

## 2016-12-30 DIAGNOSIS — T148XXA Other injury of unspecified body region, initial encounter: ICD-10-CM

## 2016-12-30 DIAGNOSIS — A499 Bacterial infection, unspecified: ICD-10-CM

## 2016-12-30 DIAGNOSIS — E119 Type 2 diabetes mellitus without complications: ICD-10-CM

## 2016-12-30 DIAGNOSIS — F99 Mental disorder, not otherwise specified: ICD-10-CM

## 2016-12-30 DIAGNOSIS — Z792 Long term (current) use of antibiotics: Principal | ICD-10-CM

## 2016-12-30 DIAGNOSIS — S91002D Unspecified open wound, left ankle, subsequent encounter: Principal | ICD-10-CM

## 2016-12-30 MED ORDER — VANCOMYCIN 1,500 MG IVPB
1500 mg | Freq: Two times a day (BID) | INTRAVENOUS | 0 refills | 30.00000 days | Status: AC
Start: 2016-12-30 — End: 2017-02-04

## 2016-12-30 NOTE — Progress Notes
Orders per Dr. Catha Brow.   1.Increase  Vanc to 1.5gm q 12 hours  2.Check vanc trough 10/4    Spoke to [facility] with [facility] and provided above order.  Left VM Robin with [facility], faxed order.   Left VM with pt to review and requested call back.

## 2016-12-30 NOTE — Progress Notes
Date of Service: 12/30/2016    CC:   Osteomyelitis or septic joint (was draining synovial fluid at referring hospital)      Joyce Mcdowell is a 51 y.o. female.    History of Present Illness  Patient from [facility], who was followed by Dr. Wilkie Aye in orthopedic clinic for left ankle fracture that occurred after stepping into a ditch and falling.  She has been previously in a cam walker, had pain, and had injuries to the ankle as a child and has had problems with the knee.  MRI showed lateral ligaments torn, peroneus brevis torn, and on 06/30/2016 she underwent secondary repair of the left peroneus brevis tendon and lateral ankle ligament reconstruction.  She had a postoperative hematoma, which was drained cultured and seemed to be doing well.  On 10/28/2016 she had a wound in the area of the previous hematoma was but was progressing and what appeared to be satisfactory condition with wound care locally.    The patient said that the ankle wound continued to drain throughout, never stopping actually.  When she was seen a week ago on 12/22/2016, she said that she had had a problem for a week.  She had fever low-grade to 100.5, felt ill, had erythema around the ankle wound, more drainage, and had been off antibiotics for several weeks.  The culture had been taken in the clinic by Dr. Wilkie Aye and it grew Peptostreptococcus at that time.  She was placed on oral cephalexin 504 times daily, and the fever resolved, as well as did the fatigue and sick feeling.  Drainage did continue however.  It was never huge amounts but it was serous and not greatly purulent.  In addition because of favoring the right ankle she had pain in the right knee.    Procedure on 10/31/2016:  Post-op Diagnosis   Ankle wound, left, subsequent encounter [S91.002D]  Procedure  INCISION AND DRAINAGE OF LEFT ANKLE. (Left)  WOUND VAC PALCEMENT LEFT ANKLE (Left)  REMOVAL HARDWARE -DEEP (Left)  ???  Cultures showed the following: 07/22/2016 moderate Staphylococcus coagulase-negative,   no fungal growth, no anaerobic growth.  10/28/2016 left lateral ankle Corynebacterium species light growth,   no anaerobes  10/31/2016 left ankle, Corynebacterium pseudodiphtheriticum  Susceptibility ([facility])   12/18/16 Left foot/ankle wound Peptostreptococcus spp  Gram stain showed GPR ~ diphtheroids that did not grow    Antibiotics used at the 10/31/16 visit:  Cefazolin 10/31/16 to 11/03/16  Intra-operatively, used powdered vancomycin in the wound  Doxycycline 100 mg po BID 11/03/16 to 11/17/16  Levofloxacin 750 mg po daily 8/6 to 11/17/16      12/22/16 Procedure by Dr Wilkie Aye  I&D with wound vac placement    12/22/16 cultures from operative procedure  Haemophilus para-influenzae  Corynebacterium species  Finegoldia magna  Anaerococcus vaginalis    A PICC line was placed and she was started on IV antibiotics as follows:  Vancomycin 1.5 g every 24 hours  Ertapenem 1.5 g IV every 24 hours    Vancomycin trough on 10 1 was less than 2.3 and patient did not miss any doses she says  Monitoring   ESR was 14    The ankle wound is still open and wears a wound vac. There is no associated cellulitis now and scanty drainage.  The PICC site is ok  She is tolerating the antibiotics better than she tolerated the oral bactrim or others.  She is taking the IV antibiotic at home so does not have to  drive for every dose.  So she is ok with taking it BID.  Discussed the potential for ADE and nephrotoxicity so she needs to be drinking lots of fluids.  She understood.  We discussed PICC care too to keep from getting infection of it and not getting it wet with shower or tub, etc.                Review of Systems   All other systems reviewed and are negative.        Objective:         ??? acetaminophen (TYLENOL) 500 mg tablet Take 1,000 mg by mouth three times daily. Max of 4,000 mg of acetaminophen in 24 hours.   ??? aspirin EC 81 mg tablet Take 81 mg by mouth at bedtime daily. Take with food. ??? atorvastatin (LIPITOR) 20 mg tablet TAKE 1 TABLET BY MOUTH DAILY AT BEDTIME   ??? celecoxib (CELEBREX) 200 mg capsule Take 200 mg by mouth twice daily.   ??? dapagliflozin 10 mg tab Take 1 tablet by mouth daily.   ??? docusate (COLACE) 100 mg capsule Take 1 capsule by mouth twice daily.   ??? ertapenem (INVANZ) 1 g/10 mL 1,500 mg in sodium chloride 0.9% (NS) 0.9 % 115 mL IVPB Administer one thousand five hundred mg through vein every 24 hours.   ??? estradiol (ESTRACE) 1 mg tablet Take 1 mg by mouth at bedtime daily.   ??? fexofenadine(+) (ALLEGRA) 180 mg tablet Take 180 mg by mouth daily.   ??? fluticasone (FLONASE) 50 mcg/actuation nasal spray Apply 1 spray to each nostril as directed twice daily as needed. Shake bottle gently before using.   ??? levothyroxine (SYNTHROID) 125 mcg tablet TAKE 1 TABLET BY MOUTH DAILY   ??? lisinopril (PRINIVIL; ZESTRIL) 20 mg tablet TAKE 1 TABLET BY MOUTH EVERY NIGHT   ??? metFORMIN (GLUCOPHAGE) 500 mg tablet TAKE 1 TABLET BY MOUTH TWICE DAILY   ??? other medication Take 1 Dose by mouth. Medication Name & Strength: Caniboid Oil  Dose(how many): Unknown Frequency(how often): Twice Daily Sublingual   ??? oxyCODONE (ROXICODONE, OXY-IR) 5 mg tablet Take one tablet to two tablets by mouth every 4 hours as needed   ??? pantoprazole DR (PROTONIX) 40 mg tablet TAKE 1 TABLET BY MOUTH TWICE DAILY   ??? sitaGLIPtin (JANUVIA) 100 mg tab tablet Take 1 tablet by mouth daily.   ??? trimethoprim/sulfamethoxazole (BACTRIM DS) 160/800 mg tablet Take one tablet by mouth twice daily for 10 days.   ??? vancomycin (VANCOCIN) 1,500 mg in sodium chloride 0.9% (NS) 0.9 % 130 mL IVPB Administer one thousand five hundred mg through vein every 24 hours.   ??? venlafaxine XR (EFFEXOR XR) 75 mg capsule Take 75 mg by mouth daily.   ??? vitamins, multiple tablet Take 1 tablet by mouth daily.     There were no vitals filed for this visit.  There is no height or weight on file to calculate BMI.     Physical Exam Constitutional: She is oriented to person, place, and time. She appears well-developed and well-nourished.   HENT:   Head: Normocephalic and atraumatic.   Right Ear: External ear normal.   Left Ear: External ear normal.   Nose: Nose normal.   Mouth/Throat: Oropharynx is clear and moist.   Eyes: Pupils are equal, round, and reactive to light. Conjunctivae and EOM are normal.   Neck: Normal range of motion. Neck supple.   Cardiovascular: Normal rate, regular rhythm and normal heart sounds.  Pulmonary/Chest: Effort normal.   Abdominal: Soft.   Musculoskeletal: Normal range of motion.   Neurological: She is alert and oriented to person, place, and time.   Skin: Skin is warm and dry.   Psychiatric: She has a normal mood and affect. Her behavior is normal. Judgment and thought content normal.   Vitals reviewed.           Assessment and Plan:    Antibiotic indications  Left ankle draining wound since April 2018  Putative osteomyelitis and or septic joint, but there were several procedures to repair torn tendons  Post left ankle lateral ligamentous repair in April 2018  Synovial fluid  leaking reportedly at [facility] wound care clinic prior to transfer to [facility] ortho  Reduction in drainage when placed on oral cephalexin one week ago  ???  Microbes isolated see above for the variety being found.  07/22/2016 moderate Staphylococcus coagulase-negative,   no fungal growth, no anaerobic growth.    10/28/2016 left lateral ankle Corynebacterium species light growth,   no anaerobes    10/31/2016 left ankle, Corynebacterium pseudodiphtheriticum  Susceptibility ([facility])     12/18/16 Left foot/ankle wound Peptostreptococcus spp  Gram stain showed GPR ~ diphtheroids that did not grow    12/22/16 cultures from operative procedure  Haemophilus para-influenzae  Corynebacterium species  Finegoldia magna  Anaerococcus vaginalis  ???  Antibiotic selected  PICC with IV antibiotics selected  Based on the recurrent isolates and susceptibility studies, these antibiotics were selected.    Vancomycin 1.5 gm every 12 hrs. (This was modified to every 24 hrs based on the fact that the pt had to go to a facility every dose for this to be given and it was miles from her home. So this was more convenient. But note the lower than desired trough of vancomycin despite the normal ESR.)  Thus need to resume the vancomycin every 12 hrs and do another trough later this week.    Ertapenem 1.5 gm every 24 hrs  ???  Start dates, projected duration, stop dates  Plan to treat 4-6 weeks depending on the response of the infection and labs.  ???  Monitor  Weekly labs:  CBC ESR CMP, vancomycin troughs    Vancomycin troughs of 10-15 since we do not have any staph susceptibility studies    Cristopher Estimable, MD  [phone]

## 2016-12-30 NOTE — Progress Notes
She is here today for initial postoperative follow up wound check.  She is doing well.  On exam her wounds look good.  She has continued with the wound VAC.  She states that she isn't getting a lot of drainage.  She has seen Dr. Willy Eddy today.  Shew ill continue with the wound VAC.  I will see her back in two weeks.      In the presence of Ree Shay, MD,  I have taken down these notes, Danielle Rankin, [employer]. 12/30/2016 2:17 PM

## 2016-12-31 ENCOUNTER — Encounter: Admit: 2016-12-31 | Discharge: 2016-12-31 | Payer: BC Managed Care – PPO

## 2016-12-31 NOTE — Telephone Encounter
Spoke with pt regarding change in Vancomycin order. Pt understood Vancomycin 1.5g IV Q 12HRS (increased from q 24 on 10/2) and to have vanc tr drawn on 10/4 and again on 10/8.

## 2017-01-01 LAB — CULTURE-TB (AFB)

## 2017-01-01 LAB — VANCOMYCIN TROUGH: Lab: 8.1

## 2017-01-01 NOTE — Progress Notes
Vancomycin trough 8.1 01/01/17. Pt on 1.5g IV Q 12 hours.   Dr. Willy Eddy notified, agreed to continue same dose, pt will have repeat labs on Monday.     Notified Jillian RPH at [facility].

## 2017-01-05 LAB — CREATININE: Lab: 0.6

## 2017-01-05 LAB — SED RATE: Lab: 13

## 2017-01-05 LAB — ALT (SGPT): Lab: 15

## 2017-01-05 LAB — PLATELET COUNT: Lab: 187

## 2017-01-05 LAB — ALK PHOS TOTAL: Lab: 92

## 2017-01-05 LAB — CBC: Lab: 8.4

## 2017-01-05 LAB — POTASSIUM: Lab: 4.4

## 2017-01-05 LAB — VANCOMYCIN TROUGH: Lab: 11

## 2017-01-05 LAB — AST (SGOT): Lab: 16

## 2017-01-05 LAB — HEMOGLOBIN: Lab: 12

## 2017-01-05 LAB — BUN: Lab: 20

## 2017-01-06 ENCOUNTER — Encounter: Admit: 2017-01-06 | Discharge: 2017-01-06 | Payer: BC Managed Care – PPO

## 2017-01-12 ENCOUNTER — Encounter: Admit: 2017-01-12 | Discharge: 2017-01-12 | Payer: BC Managed Care – PPO

## 2017-01-12 LAB — CBC: Lab: 7.3

## 2017-01-12 LAB — C REACTIVE PROTEIN (CRP): Lab: 3.4

## 2017-01-12 LAB — HEMOGLOBIN: Lab: 12

## 2017-01-12 LAB — VANCOMYCIN TROUGH: Lab: 12

## 2017-01-12 LAB — PLATELET COUNT: Lab: 192

## 2017-01-12 LAB — ALK PHOS TOTAL: Lab: 82

## 2017-01-12 LAB — CREATININE: Lab: 0.6

## 2017-01-12 LAB — AST (SGOT): Lab: 16

## 2017-01-12 LAB — SED RATE: Lab: 12

## 2017-01-12 LAB — POTASSIUM: Lab: 4.2

## 2017-01-12 LAB — ALT (SGPT): Lab: 13

## 2017-01-12 LAB — BUN: Lab: 18

## 2017-01-13 ENCOUNTER — Encounter: Admit: 2017-01-13 | Discharge: 2017-01-13 | Payer: BC Managed Care – PPO

## 2017-01-13 ENCOUNTER — Ambulatory Visit: Admit: 2017-01-13 | Discharge: 2017-01-13 | Payer: BC Managed Care – PPO

## 2017-01-13 DIAGNOSIS — E119 Type 2 diabetes mellitus without complications: ICD-10-CM

## 2017-01-13 DIAGNOSIS — T8131XD Disruption of external operation (surgical) wound, not elsewhere classified, subsequent encounter: Principal | ICD-10-CM

## 2017-01-13 DIAGNOSIS — F99 Mental disorder, not otherwise specified: ICD-10-CM

## 2017-01-13 DIAGNOSIS — E785 Hyperlipidemia, unspecified: ICD-10-CM

## 2017-01-13 DIAGNOSIS — M1990 Unspecified osteoarthritis, unspecified site: Secondary | ICD-10-CM

## 2017-01-13 DIAGNOSIS — E079 Disorder of thyroid, unspecified: ICD-10-CM

## 2017-01-13 DIAGNOSIS — K929 Disease of digestive system, unspecified: ICD-10-CM

## 2017-01-13 DIAGNOSIS — I10 Essential (primary) hypertension: ICD-10-CM

## 2017-01-13 NOTE — Progress Notes
She is here today for postoperative wound check.  She has been in the wound VAC.  She continues to have quite a bit of drainage.  She has been going to wound care, and there is concern about her activity-related swelling.  She has some deep sutures in this that have been removed today.  Hopefully this will help convince her wound to heal in.  She feels that the tunneling has improved, but she doesn't feel like it is healing from the bottom up.  She states that her labs have been better and trending down.  She will continue with the VAC, but I would like an absorbent dressing in the base to try to keep it from getting wet.  Her VAC was adjusted to 150 last week.  We will be in contact with wound care.  I would like to get this filled in a bit more before we start packing it.      In the presence of Ree Shay, MD,  I have taken down these notes, Joyce Mcdowell, [employer]. 01/13/2017 10:10 AM     Joyce Mcdowell returns to the office due to her wound VAC.  I think that having her seen in the wound clinic would be reasonable.  We are going to pack it today and then dress it with Sorbact over that.  She is scheduled to see wound clinic on Friday.  I think that her drainage is related to lymphedema.  I would also like her to be evaluated by Dr. Dickie La in case we need to do something to cover this.      In the presence of Ree Shay, MD,  I have taken down these notes, Joyce Mcdowell, [employer]. 01/13/2017 4:11 PM

## 2017-01-18 LAB — CULTURE-WOUND/TISSUE/FLUID(AEROBIC ONLY)W/SENSITIVITY

## 2017-01-19 ENCOUNTER — Encounter: Admit: 2017-01-19 | Discharge: 2017-01-19 | Payer: BC Managed Care – PPO

## 2017-01-19 LAB — POTASSIUM: Lab: 4.4

## 2017-01-19 LAB — AST (SGOT): Lab: 21

## 2017-01-19 LAB — VANCOMYCIN TROUGH: Lab: 13

## 2017-01-19 LAB — CULTURE-ANAEROBIC

## 2017-01-19 LAB — ALK PHOS TOTAL: Lab: 85

## 2017-01-19 LAB — HEMOGLOBIN: Lab: 12

## 2017-01-19 LAB — CBC: Lab: 6.6

## 2017-01-19 LAB — CULTURE-FUNGAL,OTHER

## 2017-01-19 LAB — ALT (SGPT): Lab: 19

## 2017-01-19 LAB — SED RATE: Lab: 14

## 2017-01-19 LAB — CREATININE: Lab: 0.7

## 2017-01-19 LAB — BUN: Lab: 13

## 2017-01-19 LAB — PLATELET COUNT: Lab: 176

## 2017-01-23 ENCOUNTER — Encounter: Admit: 2017-01-23 | Discharge: 2017-01-23 | Payer: BC Managed Care – PPO

## 2017-01-23 DIAGNOSIS — Z792 Long term (current) use of antibiotics: Principal | ICD-10-CM

## 2017-01-26 LAB — CULTURE-FUNGAL,OTHER

## 2017-01-26 LAB — POTASSIUM: Lab: 4.3

## 2017-01-26 LAB — AST (SGOT): Lab: 80

## 2017-01-26 LAB — ALT (SGPT): Lab: 77

## 2017-01-26 LAB — HEMOGLOBIN: Lab: 12

## 2017-01-26 LAB — BUN: Lab: 18

## 2017-01-26 LAB — VANCOMYCIN TROUGH: Lab: 12

## 2017-01-26 LAB — PLATELET COUNT: Lab: 162

## 2017-01-26 LAB — C REACTIVE PROTEIN (CRP): Lab: 12

## 2017-01-26 LAB — ALK PHOS TOTAL: Lab: 94

## 2017-01-26 LAB — SED RATE: Lab: 10

## 2017-01-26 LAB — CREATININE: Lab: 0.6

## 2017-01-26 LAB — CBC: Lab: 6.8

## 2017-01-27 ENCOUNTER — Encounter: Admit: 2017-01-27 | Discharge: 2017-01-27 | Payer: BC Managed Care – PPO

## 2017-01-27 NOTE — Telephone Encounter
Results for TEREASE, MARCOTTE (MRN [id number]) as of 01/27/2017 11:40   Ref. Range 01/19/2017 08:20 01/26/2017 08:29   AST (SGOT) Unknown 21 80   ALT (SGPT) Unknown 19 77   Alk Phosphatase Unknown 85 94      I spoke with pt about holding atorvastatin 96m PO daily until f/u apt on 02/04/17 per Dr. HNeita Carp

## 2017-01-28 ENCOUNTER — Encounter: Admit: 2017-01-28 | Discharge: 2017-01-28 | Payer: BC Managed Care – PPO

## 2017-01-30 LAB — CULTURE-WOUND/TISSUE/FLUID(AEROBIC ONLY)W/SENSITIVITY

## 2017-02-02 ENCOUNTER — Encounter: Admit: 2017-02-02 | Discharge: 2017-02-02 | Payer: BC Managed Care – PPO

## 2017-02-02 DIAGNOSIS — Z792 Long term (current) use of antibiotics: Principal | ICD-10-CM

## 2017-02-02 LAB — AST (SGOT): Lab: 292

## 2017-02-02 LAB — CREATININE: Lab: 0.6

## 2017-02-02 LAB — CBC: Lab: 6.9

## 2017-02-02 LAB — BUN: Lab: 19

## 2017-02-02 LAB — ALT (SGPT): Lab: 341

## 2017-02-02 LAB — POTASSIUM: Lab: 4.1

## 2017-02-02 LAB — SED RATE: Lab: 15

## 2017-02-02 LAB — PLATELET COUNT: Lab: 189

## 2017-02-02 LAB — HEMOGLOBIN: Lab: 12

## 2017-02-02 LAB — VANCOMYCIN TROUGH: Lab: 12

## 2017-02-02 LAB — ALK PHOS TOTAL: Lab: 115

## 2017-02-02 MED ORDER — DOXYCYCLINE HYCLATE 100 MG PO TAB
100 mg | ORAL_TABLET | Freq: Two times a day (BID) | ORAL | 0 refills | 8.00000 days | Status: AC
Start: 2017-02-02 — End: 2017-02-05

## 2017-02-02 MED ORDER — LEVOFLOXACIN 250 MG PO TAB
750 mg | ORAL_TABLET | Freq: Every day | ORAL | 0 refills | 7.00000 days | Status: AC
Start: 2017-02-02 — End: 2017-02-05

## 2017-02-02 NOTE — Telephone Encounter
I spoke with pt and due to elevated LFT's, per Dr. Willy Eddy, pt agreed to stop taking CBD oil and  D/C Ertapenem 1.5g IV Q 24 HRS which was also verified with [facility]m in [place].   Pt will have labs drawn 02/03/17 at [facility] outpatient labs.   I sent orders to [id number] - [place], [place] - [address] for   -Doxycyline 100mg  BID   -Levaquin 750mg  Q day

## 2017-02-03 ENCOUNTER — Encounter: Admit: 2017-02-03 | Discharge: 2017-02-03 | Payer: BC Managed Care – PPO

## 2017-02-04 ENCOUNTER — Encounter: Admit: 2017-02-04 | Discharge: 2017-02-04 | Payer: BC Managed Care – PPO

## 2017-02-04 ENCOUNTER — Ambulatory Visit: Admit: 2017-02-04 | Discharge: 2017-02-04 | Payer: BC Managed Care – PPO

## 2017-02-04 ENCOUNTER — Ambulatory Visit: Admit: 2017-02-04 | Discharge: 2017-02-05 | Payer: BC Managed Care – PPO

## 2017-02-04 DIAGNOSIS — F99 Mental disorder, not otherwise specified: ICD-10-CM

## 2017-02-04 DIAGNOSIS — T8131XS Disruption of external operation (surgical) wound, not elsewhere classified, sequela: Principal | ICD-10-CM

## 2017-02-04 DIAGNOSIS — R52 Pain, unspecified: ICD-10-CM

## 2017-02-04 DIAGNOSIS — E785 Hyperlipidemia, unspecified: ICD-10-CM

## 2017-02-04 DIAGNOSIS — M1990 Unspecified osteoarthritis, unspecified site: Principal | ICD-10-CM

## 2017-02-04 DIAGNOSIS — K929 Disease of digestive system, unspecified: ICD-10-CM

## 2017-02-04 DIAGNOSIS — E119 Type 2 diabetes mellitus without complications: ICD-10-CM

## 2017-02-04 DIAGNOSIS — M00071 Staphylococcal arthritis, right ankle and foot: ICD-10-CM

## 2017-02-04 DIAGNOSIS — L97322 Non-pressure chronic ulcer of left ankle with fat layer exposed: ICD-10-CM

## 2017-02-04 DIAGNOSIS — S91002A Unspecified open wound, left ankle, initial encounter: ICD-10-CM

## 2017-02-04 DIAGNOSIS — T8131XD Disruption of external operation (surgical) wound, not elsewhere classified, subsequent encounter: ICD-10-CM

## 2017-02-04 DIAGNOSIS — E079 Disorder of thyroid, unspecified: ICD-10-CM

## 2017-02-04 DIAGNOSIS — A499 Bacterial infection, unspecified: ICD-10-CM

## 2017-02-04 DIAGNOSIS — I10 Essential (primary) hypertension: ICD-10-CM

## 2017-02-04 DIAGNOSIS — Z792 Long term (current) use of antibiotics: ICD-10-CM

## 2017-02-04 DIAGNOSIS — M25472 Effusion, left ankle: ICD-10-CM

## 2017-02-04 LAB — CBC AND DIFF
Lab: 0.1 10*3/uL (ref 0–0.20)
Lab: 0.6 10*3/uL (ref 0–0.80)
Lab: 0.7 10*3/uL — ABNORMAL HIGH (ref 0–0.45)
Lab: 1 % (ref >60)
Lab: 10 % — ABNORMAL HIGH (ref >60)
Lab: 10 FL — ABNORMAL HIGH (ref 7–11)
Lab: 13 % (ref 11–15)
Lab: 2 10*3/uL (ref 1.0–4.8)
Lab: 207 K/UL (ref 150–400)
Lab: 27 % — ABNORMAL HIGH (ref 24–44)
Lab: 29 pg (ref 26–34)
Lab: 3.8 10*3/uL (ref 1.8–7.0)
Lab: 32 g/dL (ref 32.0–36.0)
Lab: 4.5 M/UL — ABNORMAL HIGH (ref 4.0–5.0)
Lab: 40 % (ref 36–45)
Lab: 53 % (ref 41–77)
Lab: 7.2 10*3/uL (ref 4.5–11.0)
Lab: 9 % (ref 4–12)
Lab: 90 FL (ref 80–100)

## 2017-02-04 LAB — HEPATITIS B CORE AB TOT (IGG+IGM): Lab: NEGATIVE

## 2017-02-04 LAB — COMPREHENSIVE METABOLIC PANEL
Lab: 138 MMOL/L (ref 137–147)
Lab: 3.9 MMOL/L (ref 3.5–5.1)

## 2017-02-04 LAB — SED RATE: Lab: 22 mm/h (ref 0–30)

## 2017-02-04 LAB — HEPATITIS C ANTIBODY W REFLEX HCV PCR QUANT: Lab: NEGATIVE

## 2017-02-04 NOTE — Progress Notes
Date of Service: 02/04/2017    CC :       Osteomyelitis, septic joint, left ankle     Joyce Mcdowell is a 51 y.o. female.    History of Present Illness  Patient from [facility], who has been followed in orthopedic clinic by Dr. Wilkie Aye for left ankle fracture.  She previously slipped and fell into a ditch injuring her ankle.  She had previously had ankle injuries as a child and had problems with the knee subsequently 2.  She had torn the lateral ligaments, peroneus brevis tendon, and on 06/30/2016 she underwent repair, sustained postop hematoma, drained cultured, and on 10/28/2016 2 wound had.  In the area of the previous hematoma.  The patient said it continued to drain, and on 12/22/2016, she had a problem for a week with low-grade fever.  Felt ill, and the ankle became red, more drainage, and she had not been taking antibiotics for a few weeks at that time.    Culture showed Peptostreptococcus, placed on oral cephalexin, fevers resolved, he was improved, but the drainage continued.  He was scanty and amounts, serous, and she was brought back in for another procedure.  Those procedures and cultures are as follows:    Procedure on 10/31/2016:  Post-op Diagnosis   Ankle wound, left, subsequent encounter [S91.002D]  Procedure  INCISION AND DRAINAGE OF LEFT ANKLE. (Left)  WOUND VAC PALCEMENT LEFT ANKLE (Left)  REMOVAL HARDWARE -DEEP (Left)  ???  Cultures showed the following:  07/22/2016 moderate Staphylococcus coagulase-negative,   no fungal growth, no anaerobic growth.  10/28/2016 left lateral ankle Corynebacterium species light growth,   no anaerobes  10/31/2016 left ankle, Corynebacterium pseudodiphtheriticum  Susceptibility ([facility])   12/18/16???Left foot/ankle wound Peptostreptococcus spp  Gram stain showed GPR ~ diphtheroids that did not grow  ???  Antibiotics used at the 10/31/16 visit:  Cefazolin 10/31/16 to 11/03/16  Intra-operatively, used powdered vancomycin in the wound  Doxycycline 100 mg po BID 11/03/16 to 11/17/16 Levofloxacin 750 mg po daily 8/6 to 11/17/16  ???    12/22/16 Procedure by Dr Wilkie Aye  I&D with wound vac placement  ???  12/22/16 cultures from operative procedure  Haemophilus para-influenzae  Corynebacterium species  Finegoldia magna  Anaerococcus vaginalis  ???  A PICC line was placed and she was started on IV antibiotics as follows:  Vancomycin 1.5 g every 24 hours  Ertapenem 1.5 g IV every 24 hours  ???  Vancomycin trough on 10 1 was less than 2.3 and patient did not miss any doses she says  Monitoring   ESR was 14  She was first seen by ID on 10-18, for the left ankle draining since April 2018, with putative osteomyelitis and septic joint, from the organisms as above, and she was treated with IV antibiotics.  A PICC line was placed, and vancomycin was started as well as ertapenem IV.    Seen by Dr. Wilkie Aye on 01/13/2017 for wound check, using wound VAC, continue to have quite a bit of drainage, had more activity related swelling, some the sutures were removed on that day, and the Dr. Wilkie Aye schedule her to also be seen in wound clinic.  A communication with the ID O PET office, on 01/23/2017 she was saying that she tended to wear out in the afternoons but had no fever, no nausea vomiting loss of appetite.  The drainage had stopped in her opinion.  Wound care was continuing at [facility] in [place].  Wound VAC to supplied  on 01/22/2017.  The intention at that time was to continue her IV antibiotics through 02/04/2017 and to follow-up for evaluation at that time.    But it has become concerning to ID because of the increase in LFTs of late as shown in the table below:  Also note that on 02/02/17 she had 11% eosinophils. (01/26/17 eos were 9%).        The patient had ertapenem stopped on 02/02/2017.  At that time we also asked the [facility] laboratory to check the hepatitis markers on the patient as well.  The patient also said that she was taking CBD oil, and we asked her to stop it on that date as well.  The results of these are pending from the [facility].    She did have one beer the past Saturday.01/31/17. She has used tylenol and pain med with acetaminophen but not very many. 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 She stopped using the CBD oil on 02/02/17 and does not use any other herbal OTC products. She has not really felt well for some time. She has had a cough, no sputum, and no pleurisy. There is some chest discomfort in the sides of the chest but not pleurisy.     She says that the ankle slit opening is draining and has never stopped draining even since the previous operative procedures.        Review of Systems   All other systems reviewed and are negative.        Objective:         ??? acetaminophen (TYLENOL) 500 mg tablet Take 1,000 mg by mouth three times daily. Max of 4,000 mg of acetaminophen in 24 hours.   ??? aspirin EC 81 mg tablet Take 81 mg by mouth at bedtime daily. Take with food.   ??? celecoxib (CELEBREX) 200 mg capsule Take 200 mg by mouth twice daily.   ??? dapagliflozin 10 mg tab Take 1 tablet by mouth daily.   ??? doxycycline (VIBRAMYCIN) 100 mg tablet Take one tablet by mouth twice daily.   ??? estradiol (ESTRACE) 1 mg tablet Take 1 mg by mouth at bedtime daily.   ??? fexofenadine(+) (ALLEGRA) 180 mg tablet Take 180 mg by mouth daily.   ??? fluticasone (FLONASE) 50 mcg/actuation nasal spray Apply 1 spray to each nostril as directed twice daily as needed. Shake bottle gently before using.   ??? levoFLOXacin (LEVAQUIN) 250 mg tablet Take three tablets by mouth daily.   ??? levothyroxine (SYNTHROID) 125 mcg tablet TAKE 1 TABLET BY MOUTH DAILY   ??? lisinopril (PRINIVIL; ZESTRIL) 20 mg tablet TAKE 1 TABLET BY MOUTH EVERY NIGHT   ??? metFORMIN (GLUCOPHAGE) 500 mg tablet TAKE 1 TABLET BY MOUTH TWICE DAILY ??? other medication Take 1 Dose by mouth. Medication Name & Strength: Caniboid Oil  Dose(how many): Unknown Frequency(how often): Twice Daily Sublingual   ??? oxyCODONE (ROXICODONE, OXY-IR) 5 mg tablet Take one tablet to two tablets by mouth every 4 hours as needed   ??? pantoprazole DR (PROTONIX) 40 mg tablet TAKE 1 TABLET BY MOUTH TWICE DAILY   ??? sitaGLIPtin (JANUVIA) 100 mg tab tablet Take 1 tablet by mouth daily.   ??? vancomycin (VANCOCIN) 1,500 mg in dextrose 5% (D5W) 300 mL IVPB Administer one thousand five hundred mg through vein every 12 hours.   ???  venlafaxine XR (EFFEXOR XR) 75 mg capsule Take 75 mg by mouth daily.     There were no vitals filed for this visit.  There is no height or weight on file to calculate BMI.     Physical Exam   Constitutional: She is oriented to person, place, and time. She appears well-developed and well-nourished.   HENT:   Head: Normocephalic and atraumatic.   Right Ear: External ear normal.   Left Ear: External ear normal.   Nose: Nose normal.   Mouth/Throat: Oropharynx is clear and moist.   Eyes: Pupils are equal, round, and reactive to light. Conjunctivae and EOM are normal.   Neck: Normal range of motion. Neck supple.   Cardiovascular: Normal rate, regular rhythm and normal heart sounds.    Pulmonary/Chest: Effort normal and breath sounds normal.   Abdominal: Soft.   A little tender over the GB area (previously removed).  No real pain and no rebound and not referred pains.   Musculoskeletal: Normal range of motion.   Photo reviewed from Dr Wende Neighbors clinic today.  Pt does not want to take the wound vac off again.   Neurological: She is alert and oriented to person, place, and time.   Skin: Skin is warm and dry.   Psychiatric: She has a normal mood and affect. Her behavior is normal. Judgment and thought content normal.   Nursing note and vitals reviewed.            Assessment and Plan:  Antibiotic indications  Left ankle wound began after trauma/fall in 06/2016 Osteomyelitis/septic joint post repair of tendons  Post several procedures and use of wound vac  Improving wound size and reduction in drainage.    Elevated LFT: New problem  Gradually increasing elevation of the LFTs and eosinophilia  Held ertapenem, and DC vanco and started orals instead, see below  Asked her to not use CBD oil (that she told us she was using) as one on-line reference showed that about 10% who take it develop inc LFTs.  Avoid use of acetaminophen, alcohol, vinyl spray remover, acetone for finger nail remover      Microbes isolated see above for the variety being found.  Polymicrobial bacterial infection as shown here.    07/22/2016 moderate Staphylococcus coagulase-negative,   no fungal growth, no anaerobic growth.  ???  10/28/2016 left lateral ankle Corynebacterium species light growth,   no anaerobes  ???  10/31/2016 left ankle, Corynebacterium pseudodiphtheriticum  Susceptibility ([facility])   ???  12/18/16???Left foot/ankle wound Peptostreptococcus spp  Gram stain showed GPR ~ diphtheroids that did not grow  ???  12/22/16 cultures from operative procedure  Haemophilus para-influenzae  Corynebacterium species  Finegoldia magna  Anaerococcus vaginalis  ???  Antibiotic selected  PICC with IV antibiotics selected  Based on the recurrent isolates and susceptibility studies, these antibiotics were selected 12/30/16  Vancomycin???  Ertapenem held on 02/02/17 due to elevation of the LFT with ALT of 341 on 02/02/17  ESR has stayed normal in the mid teens, 14-15 but the CRP has become elevated   ???  Start dates, projected duration, stop dates  Will want to modify regimen based on the above LFT elevation  DC vanco, DC erta (done)  Remove PICC today after current dose of vancomycin is infused.    Continue the following for now:  Began on 02/02/17, oral doxycycline 100 mg po BID  Began on 02/02/17, oral levofloxacin 750 mg po daily (note weight of 315 lbs)    Monitor  Weekly labs:  CBC ESR CMP :Labs from 02/04/17 were improved slightly in terms of ALT and AST.  The hep B and C markers from [facility] hospital were reportedly negative received today 02/15/17.  ???  Cristopher Estimable, MD  [phone]

## 2017-02-04 NOTE — Progress Notes
Name:Joyce Mcdowell           MRN: [id number]                 DOB:[birth year]          Age: 51 y.o.  Date of Service: 02/04/2017    Subjective:   Left ankle wound dehiscence    Lymphedema    Possible osteomyelitis left ankle          Past Medical History:   Diagnosis Date   ??? Arthritis    ??? DM (diabetes mellitus) (HCC)    ??? Gastrointestinal disorder     gerd   ??? Hyperlipidemia    ??? Hypertension    ??? Osteoarthritis    ??? Psychiatric illness     situational depression   ??? Thyroid disease        Past Surgical History:   Procedure Laterality Date   ??? CRUCIATE AND COLLATERAL LIGAMENT REPAIR Left 06/30/2016    RECONSTRUCTION LIGAMENT COLLATERAL LOWER EXTREMITY performed by Ree Shay, MD at [facility]   ??? LEG TENDON SURGERY Left 06/30/2016    SECONDARY PERINEAL TENDON REPAIR performed by Ree Shay, MD at [facility]   ??? INCISION AND DRAINAGE Left 10/31/2016    INCISION AND DRAINAGE OF LEFT ANKLE. performed by Ree Shay, MD at [facility]   ??? HARDWARE REMOVAL Left 10/31/2016    REMOVAL HARDWARE -DEEP performed by Ree Shay, MD at [facility]   ??? LAYER WOUND CLOSURE Left 11/02/2016    REPEAT LEFT ANKLE IRRIGATION AND DEBRIDEMENT WITH SECONDARY CLOSURE performed by Ree Shay, MD at [facility]   ??? INCISION AND DRAINAGE Left 12/22/2016    INCISION AND DRAINAGE FOOT performed by Ree Shay, MD at [facility]   ??? NEGATIVE PRESSURE WOUND THERAPY Left 12/22/2016    WOUND VAC PLACEMENT performed by Ree Shay, MD at [facility]   ??? ABDOMEN SURGERY      ruptured absess that was attached to ovary   ??? FOOT NEUROMA SURGERY Left    ??? HX APPENDECTOMY     ??? HX ARTHROSCOPIC SURGERY Bilateral     4 total   ??? HX CESAREAN SECTION     ??? HX CHOLECYSTECTOMY     ??? HX HYSTERECTOMY      with absess surgery   ??? HX WRIST FRACTURE SURGERY     ??? KNEE SURGERY Right    ??? SHOULDER SURGERY Bilateral     r- cleaned out and impingement, r-tendon repair, l cleaned out and impengement       family history is not on file. Social History     Social History   ??? Marital status: Married     Spouse name: N/A   ??? Number of children: N/A   ??? Years of education: N/A     Occupational History   ??? Not on file.     Social History Main Topics   ??? Smoking status: Never Smoker   ??? Smokeless tobacco: Never Used   ??? Alcohol use 1.2 oz/week     2 Shots of liquor per week      Comment: occasional   ??? Drug use: No   ??? Sexual activity: Not on file     Other Topics Concern   ??? Not on file     Social History Narrative   ??? No narrative on file       History of Present Illness    Joyce Mcdowell is a 51  y.o. female.  Last surgery by Dr. Wilkie Aye 9;/24 with VAC application.  Currently on IV abx by Dr. Willy Eddy.  Has been having VAC changes in [facility] MWF. Has had application of [place]akin's ring for [facility] application with [employer].  Ulcer appears slightly smaller to the patient.  Has had excessive drainage from the [facility] site.  Does stand for multiple hours during the day.  Chronic lymphedema.     Review of Systems   Constitutional: Negative.    HENT: Negative.    Eyes: Negative.    Respiratory: Negative.    Cardiovascular: Negative.    Gastrointestinal: Negative.    Endocrine: Negative.    Genitourinary: Negative.    Musculoskeletal: Negative.    Skin: Positive for wound.   Allergic/Immunologic: Negative.    Neurological: Negative.    Hematological: Negative.    Psychiatric/Behavioral: Negative.        Objective:         ??? acetaminophen (TYLENOL) 500 mg tablet Take 1,000 mg by mouth three times daily. Max of 4,000 mg of acetaminophen in 24 hours.   ??? aspirin EC 81 mg tablet Take 81 mg by mouth at bedtime daily. Take with food.   ??? celecoxib (CELEBREX) 200 mg capsule Take 200 mg by mouth twice daily.   ??? dapagliflozin 10 mg tab Take 1 tablet by mouth daily.   ??? doxycycline (VIBRAMYCIN) 100 mg tablet Take one tablet by mouth twice daily.   ??? estradiol (ESTRACE) 1 mg tablet Take 1 mg by mouth at bedtime daily. ??? fexofenadine(+) (ALLEGRA) 180 mg tablet Take 180 mg by mouth daily.   ??? fluticasone (FLONASE) 50 mcg/actuation nasal spray Apply 1 spray to each nostril as directed twice daily as needed. Shake bottle gently before using.   ??? levoFLOXacin (LEVAQUIN) 250 mg tablet Take three tablets by mouth daily.   ??? levothyroxine (SYNTHROID) 125 mcg tablet TAKE 1 TABLET BY MOUTH DAILY   ??? lisinopril (PRINIVIL; ZESTRIL) 20 mg tablet TAKE 1 TABLET BY MOUTH EVERY NIGHT   ??? metFORMIN (GLUCOPHAGE) 500 mg tablet TAKE 1 TABLET BY MOUTH TWICE DAILY   ??? other medication Take 1 Dose by mouth. Medication Name & Strength: Caniboid Oil  Dose(how many): Unknown Frequency(how often): Twice Daily Sublingual   ??? oxyCODONE (ROXICODONE, OXY-IR) 5 mg tablet Take one tablet to two tablets by mouth every 4 hours as needed   ??? pantoprazole DR (PROTONIX) 40 mg tablet TAKE 1 TABLET BY MOUTH TWICE DAILY   ??? sitaGLIPtin (JANUVIA) 100 mg tab tablet Take 1 tablet by mouth daily.   ??? vancomycin (VANCOCIN) 1,500 mg in dextrose 5% (D5W) 300 mL IVPB Administer one thousand five hundred mg through vein every 12 hours.   ??? venlafaxine XR (EFFEXOR XR) 75 mg capsule Take 75 mg by mouth daily.     Vitals:    02/04/17 0820   BP: 134/80   Pulse: 98   Resp: 18   Temp: 36.4 ???C (97.5 ???F)   TempSrc: Oral   SpO2: 98%   Weight: (!) 142.9 kg (315 lb)   Height: 177.8 cm (70)     Body mass index is 45.2 kg/m???.   Physical Exam   Constitutional: She is oriented to person, place, and time. She appears well-developed.   HENT:   Head: Normocephalic and atraumatic.   Eyes: Pupils are equal, round, and reactive to light. Conjunctivae are normal.   Neck: Normal range of motion.   Cardiovascular: Normal rate and regular rhythm.  Pulmonary/Chest: Effort normal and breath sounds normal.   Abdominal: Soft.   Musculoskeletal: Normal range of motion. She exhibits edema.   Neurological: She is alert and oriented to person, place, and time.   Skin: Skin is warm and dry. Clean granulation base   Psychiatric: She has a normal mood and affect. Her behavior is normal. Judgment and thought content normal.            Wounds (NOT for Pressure Injuries) 12/22/16 1800 Foot Surgical Incision (Active)   12/22/16 1800 Foot   Wound Orientation:    Wound Type: Surgical Incision   Wound Type::    Wound Description (Comments):  sutures, duoderm, soft roll, ace, wound vac   Wound Image    02/04/2017  8:00 AM   Wound Base Assessment Moist;Red;Granulation 02/04/2017  8:00 AM   Surrounding Skin Assessment Intact 02/04/2017  8:00 AM   Wound Site Closure None 02/04/2017  8:00 AM   Wound Drainage Amount Scant 02/04/2017  8:00 AM   Wound Drainage Description Serous 02/04/2017  8:00 AM   Wound Dressing Status Changed 02/04/2017  8:00 AM   Wound Length (cm) 2 cm 02/04/2017  8:00 AM   Wound Width (cm) 0.6 cm 02/04/2017  8:00 AM   Wound Depth (cm) 1.5 cm 02/04/2017  8:00 AM   Wound Volume (cm^3) 1.8 cm^3 02/04/2017  8:00 AM   Number of days: 44                     Assessment and Plan:   VAC at -   MWF dressing changes   Duoderm periulcer   Continuous function   [place] veins LLE   Segmental double layer tubigrip

## 2017-02-04 NOTE — Progress Notes
Removed double lumen PICC from right basilic per doctor's order. Insertion site was dry, clean and intact. Catheter tip intact. No bleeding noted. Patient tolerated procedure well. Pressure dressing placed and instructed pt to leave dressing for 48 hours. Instructed to limit the amount of weight lifted with that arm to 5-7lbs for a couple of days. Pt demonstrated understanding and had no further questions.

## 2017-02-04 NOTE — Telephone Encounter
Confirmed with Angie  at ID clinic PICC was removed on 02/04/17 and IV antibiotics were discontinued. Pt to follow up in ID 02/10/17.   Alll weekly lab orders will be CONTINUED until d/c per Dr. Willy EddyHinthorn.

## 2017-02-05 ENCOUNTER — Encounter: Admit: 2017-02-05 | Discharge: 2017-02-05 | Payer: BC Managed Care – PPO

## 2017-02-05 DIAGNOSIS — Z792 Long term (current) use of antibiotics: Principal | ICD-10-CM

## 2017-02-05 MED ORDER — LEVOFLOXACIN 250 MG PO TAB
750 mg | ORAL_TABLET | Freq: Every day | ORAL | 11 refills | 7.00000 days | Status: AC
Start: 2017-02-05 — End: 2017-03-09

## 2017-02-05 MED ORDER — DOXYCYCLINE HYCLATE 100 MG PO TAB
100 mg | ORAL_TABLET | Freq: Two times a day (BID) | ORAL | 11 refills | 8.00000 days | Status: AC
Start: 2017-02-05 — End: 2017-03-09

## 2017-02-06 ENCOUNTER — Encounter: Admit: 2017-02-06 | Discharge: 2017-02-06 | Payer: BC Managed Care – PPO

## 2017-02-06 NOTE — Telephone Encounter
Patient calling to report she started having pain yesterday and then noticed bloody drainage in the vac cannister and was concerned and is having vac changed this afternoon at wound clinic in [place], also recd message from [place] on US order, discussed with Dr Virginia Rochesterrr and have them eval when vac changed today and let us know of any changes and that he gave patient a written order for the ultrasound. Called [place] and left message that pt had written order for US, called pt and discussed to have them look at wound area and let us know what they think, called [place] wound clinic and talked with Clayborne DanaPatti and discussed pt concerns and need for ultrasound and pt having hard copy of order, she will attempt to coordinate getting ultrasound done and will call to let us know how things look after dressing change, gave direct line and explained if major concerns patient should be seen in ED, she verbalizes understanding.

## 2017-02-06 NOTE — Progress Notes
SUBJECTIVE:  Joyce Mcdowell was seen today along with Dr. Armond Hang.  Dr. Wilkie Aye was kind enough to speak to me about her.  She is a lady who has had multiple procedures by Dr. Wilkie Aye.  On 06/30/2016 she had for peroneal tendon tear and lateral ankle ligamentous instability a secondary repair of the peroneus brevis and a secondary repair of the lateral ankle using a modified Brostrom technique.  On 10/31/2016 she had persistent lateral wound drainage.  She had removal of the deep implant which was a buried suture anchor in the fibula and talus as well as internal brace sutures.  She had application of a wound VAC.  She had a proximal excision of the fibula and talus for debridement.  Finally on 11/02/2016 she had bone grafting from the calcaneus to the talus and irrigation with a complex secondary closure of the lateral ankle wound.  Unfortunately she has had persistent drainage.  Of interest is that she has apparently drainage around the [facility].  Today the patient asked me not to take down the [facility], but instead to look at some pictures that another doctor had taken.  Indeed these show that the wound is quite small.  She has been going to a wound care facility around [place].  There they have told her most recently that this seems to be healing more in the bed and the physician that is seeing her declined to do any further debridement when she was last seen.  Dr. Willy Eddy has been providing antibiotics.  Recently she has had some liver enzyme changes and therefore he is modifying the medications.    The patient says that she is not having much in the way of pain, she is not having much in the way of any fevers.  She however is concerned that things are just not completely healed at this point.     IMPRESSION:  My concern is a free tissue transfer will not necessarily cure any latent infection.  In addition, we would have to resect a large amount of tissue just in order to put an implant in place.  It would require taking a soft tissue structure with vessel and there would be some donor site morbidity.  By looking at these pictures it seems to me that this should go ahead and heal if the wound is truly clean or at least healing secondarily.  Since there is no hardware whatsoever at this point, I do not see a reason why this would not just secondarily heal unless there is an infection present.  Dr. Wilkie Aye has mentioned lymphedema which could be a factor and in which case maybe she would be a candidate for a fairly broad resection of this damaged tissue.  However, this is a significant procedure.     PLAN:  I think since most recently her wound management people think things are getting better that I would recommend at least another one month of this type of treatment.        Dictated by Joyce Hane, MD and transcribed via [employer]. Copied and pasted into O2 by Eligha Bridegroom, 02/06/2017 8:21 AM

## 2017-02-09 ENCOUNTER — Encounter: Admit: 2017-02-09 | Discharge: 2017-02-09 | Payer: BC Managed Care – PPO

## 2017-02-09 DIAGNOSIS — Z792 Long term (current) use of antibiotics: Principal | ICD-10-CM

## 2017-02-09 LAB — HEMOGLOBIN: Lab: 13 mg/dL (ref 0.4–1.00)

## 2017-02-09 LAB — POTASSIUM: Lab: 3.8 U/L — ABNORMAL HIGH (ref 7–40)

## 2017-02-09 LAB — SED RATE: Lab: 10 mg/dL (ref 0.3–1.2)

## 2017-02-09 LAB — CBC: Lab: 9.1 mg/dL (ref 8.5–10.6)

## 2017-02-09 LAB — PLATELET COUNT: Lab: 233 g/dL — ABNORMAL LOW (ref 6.0–8.0)

## 2017-02-09 LAB — ALK PHOS TOTAL: Lab: 109 U/L — ABNORMAL HIGH (ref 25–110)

## 2017-02-09 LAB — CREATININE: Lab: 0.7 g/dL (ref 3.5–5.0)

## 2017-02-09 LAB — BUN: Lab: 19 10*3/uL (ref 3–12)

## 2017-02-09 LAB — AST (SGOT): Lab: 95 MMOL/L (ref 21–30)

## 2017-02-09 LAB — ALT (SGPT): Lab: 157 U/L (ref 7–56)

## 2017-02-10 ENCOUNTER — Ambulatory Visit: Admit: 2017-02-10 | Discharge: 2017-02-10 | Payer: BC Managed Care – PPO

## 2017-02-10 ENCOUNTER — Encounter: Admit: 2017-02-10 | Discharge: 2017-02-10 | Payer: BC Managed Care – PPO

## 2017-02-10 ENCOUNTER — Ambulatory Visit: Admit: 2017-02-10 | Discharge: 2017-02-11 | Payer: BC Managed Care – PPO

## 2017-02-10 DIAGNOSIS — L97322 Non-pressure chronic ulcer of left ankle with fat layer exposed: ICD-10-CM

## 2017-02-10 DIAGNOSIS — E785 Hyperlipidemia, unspecified: ICD-10-CM

## 2017-02-10 DIAGNOSIS — I10 Essential (primary) hypertension: ICD-10-CM

## 2017-02-10 DIAGNOSIS — T8189XD Other complications of procedures, not elsewhere classified, subsequent encounter: ICD-10-CM

## 2017-02-10 DIAGNOSIS — E079 Disorder of thyroid, unspecified: ICD-10-CM

## 2017-02-10 DIAGNOSIS — E119 Type 2 diabetes mellitus without complications: ICD-10-CM

## 2017-02-10 DIAGNOSIS — T8131XS Disruption of external operation (surgical) wound, not elsewhere classified, sequela: Principal | ICD-10-CM

## 2017-02-10 DIAGNOSIS — M1990 Unspecified osteoarthritis, unspecified site: Secondary | ICD-10-CM

## 2017-02-10 DIAGNOSIS — S91002D Unspecified open wound, left ankle, subsequent encounter: Principal | ICD-10-CM

## 2017-02-10 DIAGNOSIS — K929 Disease of digestive system, unspecified: ICD-10-CM

## 2017-02-10 DIAGNOSIS — F99 Mental disorder, not otherwise specified: ICD-10-CM

## 2017-02-10 DIAGNOSIS — M79606 Pain in leg, unspecified: ICD-10-CM

## 2017-02-10 DIAGNOSIS — Z792 Long term (current) use of antibiotics: ICD-10-CM

## 2017-02-10 NOTE — Progress Notes
Date of Service: 02/10/2017    CC:        Left ankle continues to drain clear fluid     Joyce Mcdowell is a 51 y.o. female.    History of Present Illness  Patient seen with Dr Wilkie Aye in his clinic and discussed extensively. She has had extensive history that was detailed in my previous note of 02/04/17.  She had injured the left ankle in December 2017, had several procedures on it, and had tendon repairs (multiple).     She had previously been operated in April, July, and September 2018.  In September she had had low-grade fever, felt ill, and the ankle is red, more drainage, has been off antibiotics.  Previous cultures have shown Peptostreptococcus, coag negative staph, Corynebacterium species, Corynebacterium pseudodiphtheriticum.  She has had on the procedure of 10/31/2016, power vancomycin placed in the wound.  She been treated with doxycycline and levofloxacin, and after the 12/22/2016 procedure had received vancomycin and ertapenem.  More recently the vancomycin and ertapenem were DC'd at the 02/04/2017 visit  After being started postoperatively at the 12/22/16 visit.  However, she continued to have drainage from the ankle despite the IV antibiotics.  They were stopped due to the elevation of her liver function studies with AST increasing, a LT, and alk phos increasing.  The initial AL T elevation was on 01/26/2017, 77, that increased by 02/02/2017 to 341.  Today 02/10/2017 that value of a LT is back to 157. However, incidentally she told us that she was also taking CBD oil and looking up the toxicity, I found one site saying that LFT increases were seen in 10% of those taking CBD oil.  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    After the IV Vanco and ertapenem were stopped on 02/04/2017, the liver functions have returned to normal.  Throughout this entire time she is continued to have drainage from the left ankle.  Examination of the ankle itself shows that all the cellulitis is long cleared.  There is no purulent drainage from the ankle.  Looking into the ankle wound itself does show clear fluid with bubbles that looks a lot like synovial fluid.  I took 3 swabs of that to culture for bacteria, acid-fast, and for fungi.    She is also being followed by Leane Call, MD of the wound care center.  She had been using wound VAC but continued to have drainage and little bit of blood in it.    She is tolerating the oral antibiotics clinically quite well just she tolerated the IV antibiotics quite well clinically without any symptoms of GI upset, has no chills or fever, no nausea vomiting diarrhea, feels okay.  The PICC line has been removed.         Review of Systems   All other systems reviewed and are negative.        Objective:         ??? aspirin EC 81 mg tablet Take 81 mg by mouth at bedtime daily. Take with food.   ??? celecoxib (CELEBREX) 200 mg capsule Take 200 mg by mouth twice daily.   ??? dapagliflozin 10 mg tab Take 1 tablet by mouth daily.   ??? doxycycline (VIBRAMYCIN) 100 mg tablet Take one tablet by mouth twice daily.   ??? estradiol (ESTRACE) 1 mg tablet Take 1 mg by  mouth at bedtime daily.   ??? fexofenadine(+) (ALLEGRA) 180 mg tablet Take 180 mg by mouth daily.   ??? fluticasone (FLONASE) 50 mcg/actuation nasal spray Apply 1 spray to each nostril as directed twice daily as needed. Shake bottle gently before using.   ??? levoFLOXacin (LEVAQUIN) 250 mg tablet Take three tablets by mouth daily.   ??? levothyroxine (SYNTHROID) 125 mcg tablet TAKE 1 TABLET BY MOUTH DAILY   ??? lisinopril (PRINIVIL; ZESTRIL) 20 mg tablet TAKE 1 TABLET BY MOUTH EVERY NIGHT   ??? metFORMIN (GLUCOPHAGE) 500 mg tablet TAKE 1 TABLET BY MOUTH TWICE DAILY   ??? pantoprazole DR (PROTONIX) 40 mg tablet TAKE 1 TABLET BY MOUTH TWICE DAILY   ??? sitaGLIPtin (JANUVIA) 100 mg tab tablet Take 1 tablet by mouth daily. ??? venlafaxine XR (EFFEXOR XR) 75 mg capsule Take 75 mg by mouth daily.     Vitals:    02/10/17 0848   BP: 146/84   Pulse: 93   Temp: 36.5 ???C (97.7 ???F)   Weight: (!) 142.9 kg (315 lb)   Height: 177.8 cm (70)     Body mass index is 45.2 kg/m???.     Physical Exam   Constitutional: She is oriented to person, place, and time. She appears well-developed and well-nourished.   HENT:   Head: Normocephalic and atraumatic.   Right Ear: External ear normal.   Left Ear: External ear normal.   Nose: Nose normal.   Mouth/Throat: Oropharynx is clear and moist.   Eyes: Pupils are equal, round, and reactive to light. Conjunctivae and EOM are normal.   Neck: Normal range of motion. Neck supple.   Cardiovascular: Normal rate, regular rhythm and normal heart sounds.    Pulmonary/Chest: Effort normal and breath sounds normal.   Abdominal: Soft.   Musculoskeletal: Normal range of motion.   Left ankle site has a 1 inch slit like opening.  There is scanty drainage that can be seen deep in the wound using a bright light.   Neurological: She is alert and oriented to person, place, and time.   Skin: Skin is warm and dry.   Psychiatric: She has a normal mood and affect. Her behavior is normal. Judgment and thought content normal.   Nursing note reviewed.           Assessment and Plan:  Antibiotic indications  Left ankle wound began after trauma/fall in Dec 2017 for the initial injury husband informed me  Osteomyelitis/septic joint post repair of tendons  Post several procedures and use of wound vac  Improving wound size and reduction in drainage.  ???  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  ??? Microbes isolated see above for the variety being found.  Polymicrobial bacterial infection as shown here.  ???  07/22/2016 moderate Staphylococcus coagulase-negative,   no fungal growth, no anaerobic growth.  ???  10/28/2016 left lateral ankle Corynebacterium species light growth,   no anaerobes  ???  10/31/2016 left ankle, Corynebacterium pseudodiphtheriticum  Susceptibility ([facility])   ???  12/18/16???Left foot/ankle wound Peptostreptococcus spp  Gram stain showed GPR ~ diphtheroids that did not grow  ???  12/22/16 cultures from operative procedure  Haemophilus para-influenzae  Corynebacterium species  Finegoldia magna  Anaerococcus vaginalis  ???New Cultures taken today by ID ([facility])>      Antibiotic selected  PICC removed and IV OPAT DC  Based on the recurrent isolates and susceptibility studies, these antibiotics were selected 12/30/16  Vancomycin???  Ertapenem held on 02/02/17 due to elevation of the LFT with ALT of 341 on 02/02/17  ESR has stayed normal  Now on orals again doxy and levofloxacin  ???  Start dates, projected duration, stop dates  ???  Continue the following for now:  Began on 02/02/17, oral doxycycline 100 mg po BID  Began on 02/02/17, oral levofloxacin 750 mg po daily (note weight of 315 lbs)  ???  Monitor  Weekly labs: to monitor the LFT and be sure we have full correction of the abnormal labs    CBC ESR CMP  Plan  Due to continued drainage that looks like synovial fluid, we have cultured today for AFB, fungi, and bacteria.  If there is no growth, Dr Wilkie Aye will close the wound. If there is and if the isolates are non susceptible, we may need to select other therapies prior to closure of the wound.    ???  Cristopher Estimable, MD  [phone]  ???

## 2017-02-10 NOTE — Progress Notes
Name:Joyce Mcdowell           MRN: [id number]                 DOB:[birth year]          Age: 51 y.o.  Date of Service: 02/10/2017    Subjective:        Left ankle wound dehiscence      Infected hardware removal      Possible osteomyelitis left ankle    Past Medical History:   Diagnosis Date   ??? Arthritis    ??? DM (diabetes mellitus) (HCC)    ??? Gastrointestinal disorder     gerd   ??? Hyperlipidemia    ??? Hypertension    ??? Osteoarthritis    ??? Psychiatric illness     situational depression   ??? Thyroid disease        Past Surgical History:   Procedure Laterality Date   ??? CRUCIATE AND COLLATERAL LIGAMENT REPAIR Left 06/30/2016    RECONSTRUCTION LIGAMENT COLLATERAL LOWER EXTREMITY performed by Ree Shay, [facility]   ??? LEG TENDON SURGERY Left 06/30/2016    SECONDARY PERINEAL TENDON REPAIR performed by Ree Shay, [facility]   ??? INCISION AND DRAINAGE Left 10/31/2016    INCISION AND DRAINAGE OF LEFT ANKLE. performed by Ree Shay, MD [facility]   ??? HARDWARE REMOVAL Left 10/31/2016    REMOVAL HARDWARE -DEEP performed by Ree Shay, MD [facility]   ??? LAYER WOUND CLOSURE Left 11/02/2016    REPEAT LEFT ANKLE IRRIGATION AND DEBRIDEMENT WITH SECONDARY CLOSURE performed by Ree Shay, MD [facility]   ??? INCISION AND DRAINAGE Left 12/22/2016    INCISION AND DRAINAGE FOOT performed by Ree Shay, MD [facility]   ??? NEGATIVE PRESSURE WOUND THERAPY Left 12/22/2016    WOUND VAC PLACEMENT performed by Ree Shay, MD [facility]   ??? ABDOMEN SURGERY      ruptured absess that was attached to ovary   ??? FOOT NEUROMA SURGERY Left    ??? HX APPENDECTOMY     ??? HX ARTHROSCOPIC SURGERY Bilateral     4 total   ??? HX CESAREAN SECTION     ??? HX CHOLECYSTECTOMY     ??? HX HYSTERECTOMY      with absess surgery   ??? HX WRIST FRACTURE SURGERY     ??? KNEE SURGERY Right    ??? SHOULDER SURGERY Bilateral     r- cleaned out and impingement, r-tendon repair, l cleaned out and impengement family history is not on file.    Social History     Social History   ??? Marital status: Married     Spouse name: N/A   ??? Number of children: N/A   ??? Years of education: N/A     Occupational History   ??? Not on file.     Social History Main Topics   ??? Smoking status: Never Smoker   ??? Smokeless tobacco: Never Used   ??? Alcohol use 1.2 oz/week     2 Shots of liquor per week      Comment: occasional   ??? Drug use: No   ??? Sexual activity: Not on file     Other Topics Concern   ??? Not on file     Social History Narrative   ??? No narrative on file       History of Present Illness    Joyce Mcdowell is  a 51 y.o. female.   VAC at - resulted in pain, excessive drainage, subsequent discontinuation of treatment.  Now the patient has had the VAC discontinued by Dr. Wilkie Aye.  Will apply Aquacel Ag.  Still receiving p.o. abx as per Dr. Willy Eddy.  Dr. Wilkie Aye is awaiting C and S results and will discuss options with me.      Review of Systems   Skin: Positive for wound.   All other systems reviewed and are negative.      Objective:         ??? aspirin EC 81 mg tablet Take 81 mg by mouth at bedtime daily. Take with food.   ??? celecoxib (CELEBREX) 200 mg capsule Take 200 mg by mouth twice daily.   ??? dapagliflozin 10 mg tab Take 1 tablet by mouth daily.   ??? doxycycline (VIBRAMYCIN) 100 mg tablet Take one tablet by mouth twice daily.   ??? estradiol (ESTRACE) 1 mg tablet Take 1 mg by mouth at bedtime daily.   ??? fexofenadine(+) (ALLEGRA) 180 mg tablet Take 180 mg by mouth daily.   ??? fluticasone (FLONASE) 50 mcg/actuation nasal spray Apply 1 spray to each nostril as directed twice daily as needed. Shake bottle gently before using.   ??? levoFLOXacin (LEVAQUIN) 250 mg tablet Take three tablets by mouth daily.   ??? levothyroxine (SYNTHROID) 125 mcg tablet TAKE 1 TABLET BY MOUTH DAILY   ??? lisinopril (PRINIVIL; ZESTRIL) 20 mg tablet TAKE 1 TABLET BY MOUTH EVERY NIGHT   ??? metFORMIN (GLUCOPHAGE) 500 mg tablet TAKE 1 TABLET BY MOUTH TWICE DAILY ??? pantoprazole DR (PROTONIX) 40 mg tablet TAKE 1 TABLET BY MOUTH TWICE DAILY   ??? sitaGLIPtin (JANUVIA) 100 mg tab tablet Take 1 tablet by mouth daily.   ??? venlafaxine XR (EFFEXOR XR) 75 mg capsule Take 75 mg by mouth daily.     Vitals:    02/10/17 1052   BP: 156/86   Pulse: 86   Resp: 20   SpO2: 97%     There is no height or weight on file to calculate BMI.   Physical Exam   Constitutional: She appears well-developed.   HENT:   Head: Normocephalic and atraumatic.   Eyes: Pupils are equal, round, and reactive to light. Conjunctivae are normal.   Neck: Normal range of motion.   Cardiovascular: Normal rate and regular rhythm.    Pulmonary/Chest: Effort normal.   Abdominal: Soft.   Musculoskeletal: Normal range of motion.   Neurological: She is alert.   Skin: Skin is warm and dry.   Dehiscence site left ankle, as measured without odor or purulent drainage   Psychiatric: She has a normal mood and affect. Her behavior is normal. Judgment and thought content normal.            Wounds (NOT for Pressure Injuries) 12/22/16 1800 Foot Surgical Incision (Active)   12/22/16 1800 Foot   Wound Orientation:    Wound Type: Surgical Incision   Wound Type::    Wound Description (Comments):  sutures, duoderm, soft roll, ace, wound vac   Wound Image   02/10/2017 10:00 AM   Wound Base Assessment Moist;Pink 02/10/2017 10:00 AM   Surrounding Skin Assessment Intact 02/10/2017 10:00 AM   Wound Site Closure None 02/10/2017 10:00 AM   Wound Drainage Amount Moderate 02/10/2017 10:00 AM   Wound Drainage Description Serosanguineous 02/10/2017 10:00 AM   Wound Dressing Status Changed 02/10/2017 10:00 AM   Wound Length (cm) 2 cm 02/10/2017 10:00 AM   Wound  Width (cm) 0.5 cm 02/10/2017 10:00 AM   Wound Depth (cm) 1.7 cm 02/10/2017 10:00 AM   Wound Surface Area (cm^2) 1 cm^2 02/10/2017 10:00 AM   Wound Volume (cm^3) 1.7 cm^3 02/10/2017 10:00 AM   Wound Healing % (Wound Team Only) 5.56 02/10/2017 10:00 AM   Number of days: 50     Assessment and Plan: Clean and dry  Aquacel Ag daily  Continue tubigrip compression  RTC after discussion with Dr. Wilkie Aye

## 2017-02-10 NOTE — Progress Notes
She is here today for postoperative follow up of her left ankle wound.  She has continued with use of her wound VAC.  The Tubigrip seems to be helping.  She has seen Dr. Dickie Laoby.  Her wound doesn't look that impressive, but it continues to have a fair bit of clear drainage.  The VAC was turned up to 175.  It caused a lot of pain and bloody drainage.  It was turned back down, and that has improved.  Her last two cultures have been negative.  It doesn't look particularly worrisome.  There is no purulence.  She come off of her IV antibiotics.  She did have corynebacterium back in August.  That has been treated.  She has not had any constitutional symptoms.      I am at a loss.  We have really tried to put our heads together in a multi-disciplinary way, and still keep coming up with this persistent problem.  I really feel like this is synovial fluid to a great extent.  It's certainly not pus.  I don't feel like the VAC is really cause leading to resolution of this.  In fact, part of me feel like it's continuing to pull synovial fluid through this.  She is going to see Dr. Virginia Rochesterrr today.  I will talk with him.  I don't know if there is anything hydrophilic that we could use.  I guess I'm inclined to consider ellipsing the edges of this and trying to close it secondarily.  Certainly this has some potential risk of trapping infectious fluid and/or opening back up.  I'm not so sure how that's different from what we have now however.  The surgery, convalescence and rehab, along with the risks, hazards and benefits were discussed with Joyce Mcdowell in detail.        Surgery:  Left ankle wound debridement and secondary closure      In the presence of Joyce ShayGreg Laurell Coalson, MD,  I have taken down these notes, Joyce Mcdowell, [employer] Corporation[employer]ribe. 02/10/2017 10:00 AM

## 2017-02-11 LAB — GRAM STAIN

## 2017-02-15 LAB — CULTURE-WOUND/TISSUE/FLUID(AEROBIC ONLY)W/SENSITIVITY

## 2017-02-16 ENCOUNTER — Encounter: Admit: 2017-02-16 | Discharge: 2017-02-16 | Payer: BC Managed Care – PPO

## 2017-02-16 LAB — CULTURE-FUNGAL,OTHER

## 2017-02-16 LAB — BUN: Lab: 22

## 2017-02-16 LAB — CREATININE: Lab: 0.7

## 2017-02-16 LAB — POTASSIUM: Lab: 4.4

## 2017-02-16 LAB — ALK PHOS TOTAL: Lab: 94

## 2017-02-16 LAB — SED RATE: Lab: 7

## 2017-02-16 LAB — ALT (SGPT): Lab: 63

## 2017-02-16 LAB — AST (SGOT): Lab: 43

## 2017-02-23 ENCOUNTER — Encounter: Admit: 2017-02-23 | Discharge: 2017-02-23 | Payer: BC Managed Care – PPO

## 2017-02-23 DIAGNOSIS — S91002D Unspecified open wound, left ankle, subsequent encounter: Principal | ICD-10-CM

## 2017-02-23 LAB — ALT (SGPT): Lab: 16

## 2017-02-23 LAB — HEMOGLOBIN: Lab: 13

## 2017-02-23 LAB — ALK PHOS TOTAL: Lab: 96

## 2017-02-23 LAB — SED RATE: Lab: 8

## 2017-02-23 LAB — AST (SGOT): Lab: 24

## 2017-02-23 LAB — CBC: Lab: 8.6

## 2017-02-23 LAB — BUN: Lab: 21

## 2017-02-23 LAB — POTASSIUM: Lab: 4.3

## 2017-02-23 LAB — PLATELET COUNT: Lab: 209

## 2017-02-23 LAB — CREATININE: Lab: 0.7

## 2017-02-25 ENCOUNTER — Encounter: Admit: 2017-02-25 | Discharge: 2017-02-25 | Payer: BC Managed Care – PPO

## 2017-02-26 ENCOUNTER — Encounter: Admit: 2017-02-26 | Discharge: 2017-02-26 | Payer: BC Managed Care – PPO

## 2017-03-01 ENCOUNTER — Ambulatory Visit: Admit: 2017-03-01 | Discharge: 2017-03-01 | Payer: BC Managed Care – PPO

## 2017-03-02 ENCOUNTER — Ambulatory Visit: Admit: 2017-03-02 | Discharge: 2017-03-02 | Payer: BC Managed Care – PPO

## 2017-03-02 ENCOUNTER — Encounter: Admit: 2017-03-02 | Discharge: 2017-03-02 | Payer: BC Managed Care – PPO

## 2017-03-02 ENCOUNTER — Encounter: Admit: 2017-03-02 | Discharge: 2017-03-03 | Payer: BC Managed Care – PPO

## 2017-03-02 DIAGNOSIS — T8189XA Other complications of procedures, not elsewhere classified, initial encounter: Principal | ICD-10-CM

## 2017-03-02 DIAGNOSIS — Z9889 Other specified postprocedural states: ICD-10-CM

## 2017-03-02 DIAGNOSIS — E119 Type 2 diabetes mellitus without complications: ICD-10-CM

## 2017-03-02 DIAGNOSIS — E785 Hyperlipidemia, unspecified: ICD-10-CM

## 2017-03-02 DIAGNOSIS — I10 Essential (primary) hypertension: ICD-10-CM

## 2017-03-02 DIAGNOSIS — E079 Disorder of thyroid, unspecified: ICD-10-CM

## 2017-03-02 DIAGNOSIS — K929 Disease of digestive system, unspecified: ICD-10-CM

## 2017-03-02 DIAGNOSIS — F99 Mental disorder, not otherwise specified: ICD-10-CM

## 2017-03-02 DIAGNOSIS — M1990 Unspecified osteoarthritis, unspecified site: Secondary | ICD-10-CM

## 2017-03-02 LAB — COMPREHENSIVE METABOLIC PANEL
Lab: 0.3 mg/dL (ref 0.3–1.2)
Lab: 0.7 mg/dL (ref 0.4–1.00)
Lab: 125 mg/dL — ABNORMAL HIGH (ref 70–100)
Lab: 137 MMOL/L (ref 137–147)
Lab: 19 U/L (ref 7–56)
Lab: 21 U/L — ABNORMAL HIGH (ref 7–40)
Lab: 25 MMOL/L (ref 21–30)
Lab: 26 mg/dL — ABNORMAL HIGH (ref 7–25)
Lab: 4 g/dL (ref 3.5–5.0)
Lab: 4.1 MMOL/L (ref 3.5–5.1)
Lab: 7.2 g/dL (ref 6.0–8.0)
Lab: 79 U/L (ref 25–110)
Lab: 9 10*3/uL (ref 3–12)
Lab: 9.5 mg/dL (ref 8.5–10.6)
Lab: >60 mL/min — ABNORMAL HIGH (ref >60)
Lab: >60 mL/min — ABNORMAL HIGH (ref >60)

## 2017-03-02 LAB — CBC AND DIFF
Lab: 0.1 10*3/uL (ref 0–0.20)
Lab: 7.8 10*3/uL (ref 4.5–11.0)

## 2017-03-02 LAB — CULTURE-TB (AFB)

## 2017-03-02 LAB — POC GLUCOSE
Lab: 114 mg/dL — ABNORMAL HIGH (ref 70–100)
Lab: 106 mg/dL — ABNORMAL HIGH (ref 70–100)

## 2017-03-02 LAB — SED RATE: Lab: 18 mm/h (ref 0–30)

## 2017-03-02 MED ORDER — PHENYLEPHRINE IN 0.9% NACL(PF) 1 MG/10 ML (100 MCG/ML) IV SYRG
INTRAVENOUS | 0 refills | Status: DC
Start: 2017-03-02 — End: 2017-03-02
  Administered 2017-03-02 (×2): 100 ug via INTRAVENOUS
  Administered 2017-03-02 (×2): 200 ug via INTRAVENOUS
  Administered 2017-03-02 (×2): 100 ug via INTRAVENOUS

## 2017-03-02 MED ORDER — FENTANYL CITRATE (PF) 50 MCG/ML IJ SOLN
0 refills | Status: DC
Start: 2017-03-02 — End: 2017-03-02
  Administered 2017-03-02: 16:00:00 100 ug via INTRAVENOUS

## 2017-03-02 MED ORDER — MIDAZOLAM 1 MG/ML IJ SOLN
INTRAVENOUS | 0 refills | Status: DC
Start: 2017-03-02 — End: 2017-03-02
  Administered 2017-03-02: 16:00:00 2 mg via INTRAVENOUS

## 2017-03-02 MED ORDER — FENTANYL CITRATE (PF) 50 MCG/ML IJ SOLN
50 ug | INTRAVENOUS | 0 refills | Status: DC | PRN
Start: 2017-03-02 — End: 2017-03-02

## 2017-03-02 MED ORDER — CEFAZOLIN 1 GRAM IJ SOLR
0 refills | Status: DC
Start: 2017-03-02 — End: 2017-03-02
  Administered 2017-03-02: 18:00:00 3 g via INTRAVENOUS

## 2017-03-02 MED ORDER — BUPIVACAINE 0.5 % (5 MG/ML) IJ SOLN
0 refills | Status: DC
Start: 2017-03-02 — End: 2017-03-02
  Administered 2017-03-02: 16:00:00 10 mL

## 2017-03-02 MED ORDER — ONDANSETRON HCL (PF) 4 MG/2 ML IJ SOLN
4 mg | Freq: Once | INTRAVENOUS | 0 refills | Status: DC | PRN
Start: 2017-03-02 — End: 2017-03-02

## 2017-03-02 MED ORDER — LIDOCAINE (PF) 10 MG/ML (1 %) IJ SOLN
.1-2 mL | INTRAMUSCULAR | 0 refills | Status: DC | PRN
Start: 2017-03-02 — End: 2017-03-02

## 2017-03-02 MED ORDER — DIPHENHYDRAMINE HCL 50 MG/ML IJ SOLN
25 mg | Freq: Once | INTRAVENOUS | 0 refills | Status: DC | PRN
Start: 2017-03-02 — End: 2017-03-02

## 2017-03-02 MED ORDER — DEXTRAN 70-HYPROMELLOSE (PF) 0.1-0.3 % OP DPET
0 refills | Status: DC
Start: 2017-03-02 — End: 2017-03-02
  Administered 2017-03-02: 18:00:00 2 [drp] via OPHTHALMIC

## 2017-03-02 MED ORDER — OXYCODONE 5 MG PO TAB
5-10 mg | ORAL_TABLET | ORAL | 0 refills | Status: SS | PRN
Start: 2017-03-02 — End: 2017-05-26

## 2017-03-02 MED ORDER — ONDANSETRON HCL 4 MG PO TAB
4 mg | ORAL_TABLET | ORAL | 0 refills | Status: SS | PRN
Start: 2017-03-02 — End: 2017-05-21

## 2017-03-02 MED ORDER — VANCOMYCIN 1,000 MG IV SOLR
0 refills | Status: DC
Start: 2017-03-02 — End: 2017-03-02
  Administered 2017-03-02: 19:00:00 1 g via TOPICAL

## 2017-03-02 MED ORDER — ONDANSETRON HCL (PF) 4 MG/2 ML IJ SOLN
INTRAVENOUS | 0 refills | Status: DC
Start: 2017-03-02 — End: 2017-03-02
  Administered 2017-03-02: 19:00:00 4 mg via INTRAVENOUS

## 2017-03-02 MED ORDER — LACTATED RINGERS IV SOLP
1000 mL | INTRAVENOUS | 0 refills | Status: DC
Start: 2017-03-02 — End: 2017-03-02
  Administered 2017-03-02: 15:00:00 1000 mL via INTRAVENOUS

## 2017-03-02 MED ORDER — HYDROMORPHONE (PF) 2 MG/ML IJ SYRG
.5-1 mg | INTRAVENOUS | 0 refills | Status: DC | PRN
Start: 2017-03-02 — End: 2017-03-02

## 2017-03-02 MED ORDER — DEXAMETHASONE SODIUM PHOSPHATE 4 MG/ML IJ SOLN
INTRAVENOUS | 0 refills | Status: DC
Start: 2017-03-02 — End: 2017-03-02
  Administered 2017-03-02 (×2): 2 mg via INTRAVENOUS

## 2017-03-02 MED ORDER — ROPIVACAINE (PF) 5 MG/ML (0.5 %) IJ SOLN
0 refills | Status: DC
Start: 2017-03-02 — End: 2017-03-02
  Administered 2017-03-02: 16:00:00 30 mL

## 2017-03-02 MED ORDER — HALOPERIDOL LACTATE 5 MG/ML IJ SOLN
1 mg | Freq: Once | INTRAVENOUS | 0 refills | Status: DC | PRN
Start: 2017-03-02 — End: 2017-03-02

## 2017-03-02 MED ORDER — LIDOCAINE (PF) 200 MG/10 ML (2 %) IJ SYRG
0 refills | Status: DC
Start: 2017-03-02 — End: 2017-03-02
  Administered 2017-03-02: 18:00:00 100 mg via INTRAVENOUS

## 2017-03-02 MED ORDER — PROPOFOL INJ 10 MG/ML IV VIAL
0 refills | Status: DC
Start: 2017-03-02 — End: 2017-03-02
  Administered 2017-03-02: 18:00:00 250 mg via INTRAVENOUS

## 2017-03-02 MED ORDER — OXYCODONE 5 MG PO TAB
5-10 mg | Freq: Once | ORAL | 0 refills | Status: DC | PRN
Start: 2017-03-02 — End: 2017-03-02

## 2017-03-02 MED ORDER — FENTANYL CITRATE (PF) 50 MCG/ML IJ SOLN
25 ug | INTRAVENOUS | 0 refills | Status: DC | PRN
Start: 2017-03-02 — End: 2017-03-02

## 2017-03-02 MED FILL — OXYCODONE 5 MG PO TAB: 5 mg | ORAL | 5 days supply | Qty: 50 | Fill #1 | Status: CP

## 2017-03-02 NOTE — Other
Brief Operative Note    Name: Joyce Mcdowell is a 51 y.o. female     DOB: [birth year]             MRN#: [id number]  DATE OF OPERATION: 03/02/2017    Date:  03/02/2017        Preoperative Dx:   Open wound of left ankle, subsequent encounter [S91.002D]    Post-op Diagnosis      * Open wound of left ankle, subsequent encounter [S91.002D]    Procedure(s) (LRB):  INCISION AND DRAINAGE POSTOPERATIVE WOUND LEFT ANKLE (Left)  SECONDARY CLOSURE (Left)    Anesthesia Type: General    Surgeon(s) and Role:     * Ree ShayHorton, Greg, MD - Primary     * Alberteen Samortina, Rose E, MD - Resident - Assisting     * Dorothea GlassmanHodge, Marleena Shubert A, MD - Resident - Assisting      Findings:  L ankle I/D and primary closure    Estimated Blood Loss: No blood loss documented.     Specimen(s) Removed/Disposition:   ID Type Source Tests Collected by Time Destination   A : LEFT ANKLE TISSUE Tissue Ankle CULTURE-ANAEROBIC, CULTURE-WOUND/TISSUE/FLUID(AEROBIC ONLY)W/SENSITIVITY, CULTURE-TB (AFB), Ardis RowanGRAM STAIN, CULTURE-FUNGAL,OTHER Ree ShayHorton, Greg, MD 03/02/2017 1229        Complications:  None    Implants: None    Drains: None    Disposition:  PACU - stable    Dorothea GlassmanKevin A Zein Helbing, MD  Pager [phone]

## 2017-03-02 NOTE — Anesthesia Procedure Notes
Anesthesia Procedure: Peripheral Nerve Block    PERIPHERAL NERVE BLOCK  Date/Time: 03/02/2017 10:10 AM    Patient location: pre-op  Reason for block: at surgeon's request and post-op pain management    Preprocedure checklist performed: 2 patient identifiers, risks & benefits discussed, patient evaluated, timeout performed, consent obtained, patient being monitored and sterile drape    Sterile technique:  - Proper hand washing  - Cap, mask  - Sterile gloves  - Skin prep for antisepsis        Peripheral Nerve Block Procedure   Patient position: supine  Prep: ChloraPrep    Monitoring: BP, EKG and continuous pulse ox  Block type: adductor canal  Laterality: left  Injection technique: single-shot  Procedures: ultrasound guided    Ultrasound image captured  Local infiltration: lidocaine    Needle/cathether:      Needle type: Tuohy      Needle gauge: 18 G; Needle length: 4 in     Needle location: anatomical landmarks and ultrasound guidance    Procedure Outcome   Injection assessment: negative aspiration for heme, no paresthesia on injection, incremental injection and local visualized surrounding nerve on ultrasound  Observations: adequate block, patient sedated but conversant throughout block, patient tolerated the procedure well with no immediate complications and comfortable throughout block      Refer to nursing documentation for vitals and monitoring data during procedure.    Performed by: Jeannetta EllisHAINES, Zitlaly Malson  Authorized by: Dionicio StallPURVIN, MICHAEL A

## 2017-03-02 NOTE — Anesthesia Procedure Notes
Anesthesia Procedure: Peripheral Nerve Block    PERIPHERAL NERVE BLOCK  Date/Time: 03/02/2017 10:06 AM    Patient location: pre-op  Reason for block: at surgeon's request and post-op pain management    Preprocedure checklist performed: 2 patient identifiers, risks & benefits discussed, patient evaluated, timeout performed, consent obtained, patient being monitored and sterile drape    Sterile technique:  - Proper hand washing  - Cap, mask  - Sterile gloves  - Skin prep for antisepsis        Peripheral Nerve Block Procedure   Patient position: right lateral decubitus  Prep: ChloraPrep    Monitoring: BP, EKG and continuous pulse ox  Block type: popliteal  Laterality: left  Injection technique: single-shot  Procedures: ultrasound guided    Local infiltration: lidocaine    Needle/cathether:      Needle type: Tuohy      Needle gauge: 18 G; Needle length: 4 in     Needle location: anatomical landmarks and ultrasound guidance    Procedure Outcome   Injection assessment: negative aspiration for heme, no paresthesia on injection, incremental injection and local visualized surrounding nerve on ultrasound  Observations: adequate block, patient sedated but conversant throughout block, patient tolerated the procedure well with no immediate complications and comfortable throughout block      Refer to nursing documentation for vitals and monitoring data during procedure.    Performed by: Jeannetta EllisHAINES, Glenn Christo  Authorized by: Dionicio StallPURVIN, MICHAEL A

## 2017-03-02 NOTE — Interval H&P Note
History and Physical Update Note    Allergies:  Adhesive tape (rosins)    Lab/Radiology/Other Diagnostic Tests:  24-hour labs:  No results found for this visit on 03/02/17 (from the past 24 hour(s)).  Point of Care Testing:  (Last 24 hours):         I have examined the patient, and there are no significant changes in their condition, from the previous H&P performed on 02/10/17.    Dorothea GlassmanKevin A Lamarkus Nebel, MD  Pager     --------------------------------------------------------------------------------------------------------------------------------------------

## 2017-03-02 NOTE — Anesthesia Post-Procedure Evaluation
Post-Anesthesia Evaluation    Name: Joyce Mcdowell      MRN: [id number]     DOB: Aug 01, 1965     Age: 51 y.o.     Sex: female   __________________________________________________________________________     Procedure Date: 03/02/2017  Procedure: Procedure(s) with comments:  IRRIGATION AND DEBRIDEMENT POSTOPERATIVE WOUND LEFT ANKLE - CASE LENGTH 45 MINUTES, REQUEST 1ST START IN 2ND ROOM  SECONDARY CLOSURE      Surgeon: Surgeon(s):  Ree ShayHorton, Greg, MD  Alberteen Samortina, Rose E, MD  Dorothea GlassmanHodge, Kevin A, MD    Post-Anesthesia Vitals  BP: [phone] (12/03 1330)  Temp: 36.8 C (98.2 F) (12/03 1306)  Pulse: 100 (12/03 1330)  Respirations: 14 PER MINUTE (12/03 1330)  SpO2: 96 % (12/03 1330)  O2 Delivery: None (Room Air) (12/03 1330)  SpO2 Pulse: 99 (12/03 1330)      Post Anesthesia Evaluation Note    Evaluation location: Pre/Post  Patient participation: recovered; patient participated in evaluation  Level of consciousness: alert    Pain score: 0  Pain management: adequate    Hydration: normovolemia  Temperature: 36.0C - 38.4C  Airway patency: adequate    Regional/Neuraxial:       Neurological status: sensory deficit      Single injection shot performed    Perioperative Events  Perioperative events:  no       Post-op nausea and vomiting: no PONV    Postoperative Status  Cardiovascular status: hemodynamically stable  Respiratory status: spontaneous ventilation  Follow-up needed: none        Perioperative Events  Perioperative Event: No  Emergency Case Activation: No

## 2017-03-03 LAB — GRAM STAIN

## 2017-03-04 ENCOUNTER — Encounter: Admit: 2017-03-04 | Discharge: 2017-03-04 | Payer: BC Managed Care – PPO

## 2017-03-04 DIAGNOSIS — E785 Hyperlipidemia, unspecified: ICD-10-CM

## 2017-03-04 DIAGNOSIS — M1990 Unspecified osteoarthritis, unspecified site: Principal | ICD-10-CM

## 2017-03-04 DIAGNOSIS — K929 Disease of digestive system, unspecified: ICD-10-CM

## 2017-03-04 DIAGNOSIS — E079 Disorder of thyroid, unspecified: ICD-10-CM

## 2017-03-04 DIAGNOSIS — F99 Mental disorder, not otherwise specified: ICD-10-CM

## 2017-03-04 DIAGNOSIS — I10 Essential (primary) hypertension: ICD-10-CM

## 2017-03-04 DIAGNOSIS — E119 Type 2 diabetes mellitus without complications: ICD-10-CM

## 2017-03-07 LAB — CULTURE-ANAEROBIC

## 2017-03-07 LAB — CULTURE-WOUND/TISSUE/FLUID(AEROBIC ONLY)W/SENSITIVITY

## 2017-03-09 ENCOUNTER — Encounter: Admit: 2017-03-09 | Discharge: 2017-03-09 | Payer: BC Managed Care – PPO

## 2017-03-09 MED ORDER — SULFAMETHOXAZOLE-TRIMETHOPRIM 800-160 MG PO TAB
1 | ORAL_TABLET | Freq: Three times a day (TID) | ORAL | 0 refills | Status: AC
Start: 2017-03-09 — End: ?

## 2017-03-09 NOTE — Telephone Encounter
Received verbal order per Dr. Wilkie AyeHorton, pt to discontinue Levaquin 750mg  daily and doxycycline 100mg  bid.  Pt to start Bactrim DS tid.  Ms. Lynn Itoopejoy notified and rx for Bactrim sent to pt's pharmacy.

## 2017-03-10 ENCOUNTER — Encounter: Admit: 2017-03-10 | Discharge: 2017-03-10 | Payer: BC Managed Care – PPO

## 2017-03-10 ENCOUNTER — Ambulatory Visit: Admit: 2017-03-10 | Discharge: 2017-03-11 | Payer: BC Managed Care – PPO

## 2017-03-10 DIAGNOSIS — K929 Disease of digestive system, unspecified: ICD-10-CM

## 2017-03-10 DIAGNOSIS — M1990 Unspecified osteoarthritis, unspecified site: Principal | ICD-10-CM

## 2017-03-10 DIAGNOSIS — I10 Essential (primary) hypertension: ICD-10-CM

## 2017-03-10 DIAGNOSIS — E079 Disorder of thyroid, unspecified: ICD-10-CM

## 2017-03-10 DIAGNOSIS — S91002D Unspecified open wound, left ankle, subsequent encounter: Principal | ICD-10-CM

## 2017-03-10 DIAGNOSIS — E119 Type 2 diabetes mellitus without complications: ICD-10-CM

## 2017-03-10 DIAGNOSIS — E785 Hyperlipidemia, unspecified: ICD-10-CM

## 2017-03-10 DIAGNOSIS — F99 Mental disorder, not otherwise specified: ICD-10-CM

## 2017-03-10 NOTE — Progress Notes
She is here today for initial postoperative follow up s/p left complex secondary I&D.  She grew out four colonies of staph epidermidis.  She is doing well.  She has some residual numbness.  Dr. Willy EddyHinthorn has recommended Bactrim DS three times a day.  On exam her wounds look good.  There is no erythema, and the edges of the wound look good.  Labs will be repeated on Monday and then again two weeks later.  I would use some Betadine and a dry dressing, as well as some Tubigrip to keep swelling down.  I want this to mature more before she starts walking on it.  She can put her foot down for balance at this point.  She will continue in the cam walker.  I have told her that this numbness may take a while, but it should improve.  I will see her back in two weeks.    In the presence of Ree ShayGreg Shion Bluestein, MD,  I have taken down these notes, Danielle RankinStacy Grove, [employer]ribe. 03/10/2017 12:22 PM

## 2017-03-11 ENCOUNTER — Encounter: Admit: 2017-03-11 | Discharge: 2017-03-11 | Payer: BC Managed Care – PPO

## 2017-03-13 ENCOUNTER — Encounter: Admit: 2017-03-13 | Discharge: 2017-03-13 | Payer: BC Managed Care – PPO

## 2017-03-13 DIAGNOSIS — Z792 Long term (current) use of antibiotics: Principal | ICD-10-CM

## 2017-03-16 LAB — CREATININE: Lab: 0.8

## 2017-03-16 LAB — AST (SGOT): Lab: 18

## 2017-03-16 LAB — CULTURE-FUNGAL,OTHER

## 2017-03-16 LAB — SED RATE: Lab: 11

## 2017-03-16 LAB — BUN: Lab: 19

## 2017-03-16 LAB — PLATELET COUNT: Lab: 211

## 2017-03-16 LAB — ALT (SGPT): Lab: 11

## 2017-03-16 LAB — ALK PHOS TOTAL: Lab: 77

## 2017-03-16 LAB — CBC: Lab: 7.9

## 2017-03-16 LAB — HEMOGLOBIN: Lab: 13

## 2017-03-16 LAB — POTASSIUM: Lab: 4.5

## 2017-03-18 ENCOUNTER — Encounter: Admit: 2017-03-18 | Discharge: 2017-03-18 | Payer: BC Managed Care – PPO

## 2017-03-26 ENCOUNTER — Ambulatory Visit: Admit: 2017-03-26 | Discharge: 2017-03-26 | Payer: BC Managed Care – PPO

## 2017-03-26 DIAGNOSIS — T8131XA Disruption of external operation (surgical) wound, not elsewhere classified, initial encounter: Principal | ICD-10-CM

## 2017-03-26 NOTE — Progress Notes
She is here today for postoperative wound check of her left ankle.  On exam her wound has dehisced slightly.  I don't see any evidence of infection.  I think that her swelling has gone down, and there is less tension on the sutures.  I would like to place a couple of retention sutures in this to better approximate the edges.  I will see her back in two weeks.    After consent and sterile prep, I numbed the area of the incision with 5 cc of Marcaine and 5 cc of Lidocaine sterilely and without complication.  I then proceeded to place retention sutures in the incision to better approximate the incision sterilely and without complication.  This measured approximately 3 cm in length. She was instructed on local wound care.     In the presence of Ree ShayGreg Adele Milson, MD,  I have taken down these notes, Danielle RankinStacy Grove, [employer] CorporationScribe. 03/26/2017 11:03 AM

## 2017-03-27 ENCOUNTER — Encounter: Admit: 2017-03-27 | Discharge: 2017-03-27 | Payer: BC Managed Care – PPO

## 2017-03-30 LAB — HEMOGLOBIN: Lab: 13

## 2017-03-30 LAB — PLATELET COUNT: Lab: 187

## 2017-03-30 LAB — ALT (SGPT): Lab: 24

## 2017-03-30 LAB — SED RATE: Lab: 6

## 2017-03-30 LAB — ALK PHOS TOTAL: Lab: 67

## 2017-03-30 LAB — AST (SGOT): Lab: 27

## 2017-03-30 LAB — BUN: Lab: 16

## 2017-03-30 LAB — CBC: Lab: 6.1

## 2017-03-30 LAB — CULTURE-TB (AFB)

## 2017-03-30 LAB — POTASSIUM: Lab: 4.1

## 2017-03-30 LAB — CREATININE: Lab: 0.8

## 2017-04-01 ENCOUNTER — Encounter: Admit: 2017-04-01 | Discharge: 2017-04-01 | Payer: BC Managed Care – PPO

## 2017-04-02 ENCOUNTER — Encounter: Admit: 2017-04-02 | Discharge: 2017-04-02 | Payer: BC Managed Care – PPO

## 2017-04-06 LAB — CULTURE-FUNGAL,OTHER

## 2017-04-09 ENCOUNTER — Ambulatory Visit: Admit: 2017-04-09 | Discharge: 2017-04-10 | Payer: BC Managed Care – PPO

## 2017-04-09 ENCOUNTER — Encounter: Admit: 2017-04-09 | Discharge: 2017-04-09 | Payer: BC Managed Care – PPO

## 2017-04-09 DIAGNOSIS — S91002D Unspecified open wound, left ankle, subsequent encounter: Principal | ICD-10-CM

## 2017-04-09 DIAGNOSIS — E119 Type 2 diabetes mellitus without complications: ICD-10-CM

## 2017-04-09 DIAGNOSIS — F99 Mental disorder, not otherwise specified: ICD-10-CM

## 2017-04-09 DIAGNOSIS — M1990 Unspecified osteoarthritis, unspecified site: Principal | ICD-10-CM

## 2017-04-09 DIAGNOSIS — E785 Hyperlipidemia, unspecified: ICD-10-CM

## 2017-04-09 DIAGNOSIS — E079 Disorder of thyroid, unspecified: ICD-10-CM

## 2017-04-09 DIAGNOSIS — K929 Disease of digestive system, unspecified: ICD-10-CM

## 2017-04-09 DIAGNOSIS — I10 Essential (primary) hypertension: ICD-10-CM

## 2017-04-10 LAB — GRAM STAIN

## 2017-04-17 ENCOUNTER — Encounter: Admit: 2017-04-17 | Discharge: 2017-04-17 | Payer: BC Managed Care – PPO

## 2017-04-17 LAB — CULTURE-ANAEROBIC

## 2017-04-23 LAB — AEROBIC BACTERIA, SUSCEPT

## 2017-04-23 LAB — AEROBIC ORGANISM, ID

## 2017-04-27 LAB — CULTURE-WOUND/TISSUE/FLUID(AEROBIC ONLY)W/SENSITIVITY

## 2017-04-29 ENCOUNTER — Ambulatory Visit: Admit: 2017-04-29 | Discharge: 2017-04-30 | Payer: BC Managed Care – PPO

## 2017-04-29 ENCOUNTER — Ambulatory Visit: Admit: 2017-04-29 | Discharge: 2017-04-29 | Payer: BC Managed Care – PPO

## 2017-04-29 ENCOUNTER — Encounter: Admit: 2017-04-29 | Discharge: 2017-04-29 | Payer: BC Managed Care – PPO

## 2017-04-29 DIAGNOSIS — S91002D Unspecified open wound, left ankle, subsequent encounter: ICD-10-CM

## 2017-04-29 DIAGNOSIS — F99 Mental disorder, not otherwise specified: ICD-10-CM

## 2017-04-29 DIAGNOSIS — Z792 Long term (current) use of antibiotics: Secondary | ICD-10-CM

## 2017-04-29 DIAGNOSIS — M1990 Unspecified osteoarthritis, unspecified site: Secondary | ICD-10-CM

## 2017-04-29 DIAGNOSIS — E079 Disorder of thyroid, unspecified: ICD-10-CM

## 2017-04-29 DIAGNOSIS — M19079 Primary osteoarthritis, unspecified ankle and foot: Principal | ICD-10-CM

## 2017-04-29 DIAGNOSIS — E119 Type 2 diabetes mellitus without complications: ICD-10-CM

## 2017-04-29 DIAGNOSIS — T148XXA Other injury of unspecified body region, initial encounter: ICD-10-CM

## 2017-04-29 DIAGNOSIS — R52 Pain, unspecified: Principal | ICD-10-CM

## 2017-04-29 DIAGNOSIS — E785 Hyperlipidemia, unspecified: ICD-10-CM

## 2017-04-29 DIAGNOSIS — I10 Essential (primary) hypertension: ICD-10-CM

## 2017-04-29 DIAGNOSIS — K929 Disease of digestive system, unspecified: ICD-10-CM

## 2017-04-29 DIAGNOSIS — Z01818 Encounter for other preprocedural examination: ICD-10-CM

## 2017-04-29 LAB — CBC AND DIFF
Lab: 0 % (ref >60)
Lab: 13 g/dL (ref 12.0–15.0)
Lab: 217 K/UL (ref 150–400)
Lab: 29 pg (ref 26–34)
Lab: 32 g/dL (ref 32.0–36.0)
Lab: 4.5 M/UL — ABNORMAL HIGH (ref 4.0–5.0)
Lab: 40 % (ref 36–45)
Lab: 64 % (ref 41–77)
Lab: 8 % (ref 4–12)
Lab: 89 FL (ref 80–100)

## 2017-04-29 LAB — COMPREHENSIVE METABOLIC PANEL
Lab: 104 MMOL/L (ref 98–110)
Lab: 139 MMOL/L (ref 137–147)
Lab: 15 U/L (ref 7–56)
Lab: 4.1 MMOL/L (ref 3.5–5.1)
Lab: 4.1 g/dL — ABNORMAL HIGH (ref 3.5–5.0)

## 2017-04-29 LAB — SED RATE: Lab: 12 mm/h (ref 0–30)

## 2017-04-30 ENCOUNTER — Encounter: Admit: 2017-04-30 | Discharge: 2017-04-30 | Payer: BC Managed Care – PPO

## 2017-05-01 ENCOUNTER — Encounter: Admit: 2017-05-01 | Discharge: 2017-05-01 | Payer: BC Managed Care – PPO

## 2017-05-01 DIAGNOSIS — Z01818 Encounter for other preprocedural examination: Principal | ICD-10-CM

## 2017-05-07 ENCOUNTER — Ambulatory Visit: Admit: 2017-05-07 | Discharge: 2017-05-07 | Payer: BC Managed Care – PPO

## 2017-05-07 DIAGNOSIS — Z01818 Encounter for other preprocedural examination: ICD-10-CM

## 2017-05-07 DIAGNOSIS — S91002A Unspecified open wound, left ankle, initial encounter: Principal | ICD-10-CM

## 2017-05-07 MED ORDER — SODIUM CHLORIDE 0.9 % IV SOLP
0 refills | Status: CP
Start: 2017-05-07 — End: ?
  Administered 2017-05-07: 15:00:00 1000 mL via INTRAVENOUS

## 2017-05-07 MED ORDER — MIDAZOLAM 1 MG/ML IJ SOLN
1-2 mg | Freq: Once | INTRAVENOUS | 0 refills | Status: CP
Start: 2017-05-07 — End: ?
  Administered 2017-05-07: 14:00:00 1 mg via INTRAVENOUS

## 2017-05-07 MED ORDER — MIDAZOLAM 1 MG/ML IJ SOLN
0 refills | Status: CP
Start: 2017-05-07 — End: ?
  Administered 2017-05-07: 15:00:00 1 mg via INTRAVENOUS

## 2017-05-07 MED ORDER — FENTANYL CITRATE (PF) 50 MCG/ML IJ SOLN
25-50 ug | Freq: Once | INTRAVENOUS | 0 refills | Status: CP
Start: 2017-05-07 — End: ?
  Administered 2017-05-07: 14:00:00 50 ug via INTRAVENOUS

## 2017-05-07 MED ORDER — FENTANYL CITRATE (PF) 50 MCG/ML IJ SOLN
0 refills | Status: CP
Start: 2017-05-07 — End: ?
  Administered 2017-05-07: 15:00:00 50 ug via INTRAVENOUS

## 2017-05-07 MED ORDER — IOPAMIDOL 61 % IV SOLN
70 mL | Freq: Once | INTRA_ARTERIAL | 0 refills | Status: CP
Start: 2017-05-07 — End: ?

## 2017-05-11 LAB — CULTURE-FUNGAL,OTHER

## 2017-05-19 ENCOUNTER — Ambulatory Visit: Admit: 2017-05-19 | Discharge: 2017-05-20 | Payer: BC Managed Care – PPO

## 2017-05-19 ENCOUNTER — Encounter: Admit: 2017-05-19 | Discharge: 2017-05-19 | Payer: BC Managed Care – PPO

## 2017-05-19 DIAGNOSIS — E079 Disorder of thyroid, unspecified: ICD-10-CM

## 2017-05-19 DIAGNOSIS — F99 Mental disorder, not otherwise specified: ICD-10-CM

## 2017-05-19 DIAGNOSIS — I10 Essential (primary) hypertension: ICD-10-CM

## 2017-05-19 DIAGNOSIS — S91032S Puncture wound without foreign body, left ankle, sequela: Principal | ICD-10-CM

## 2017-05-19 DIAGNOSIS — K929 Disease of digestive system, unspecified: ICD-10-CM

## 2017-05-19 DIAGNOSIS — M1990 Unspecified osteoarthritis, unspecified site: ICD-10-CM

## 2017-05-19 DIAGNOSIS — E785 Hyperlipidemia, unspecified: ICD-10-CM

## 2017-05-19 DIAGNOSIS — E119 Type 2 diabetes mellitus without complications: ICD-10-CM

## 2017-05-20 ENCOUNTER — Encounter: Admit: 2017-05-20 | Discharge: 2017-05-20 | Payer: BC Managed Care – PPO

## 2017-05-20 DIAGNOSIS — M79672 Pain in left foot: ICD-10-CM

## 2017-05-20 DIAGNOSIS — M869 Osteomyelitis, unspecified: ICD-10-CM

## 2017-05-20 LAB — GRAM STAIN

## 2017-05-20 LAB — BASIC METABOLIC PANEL
Lab: 1 mg/dL — ABNORMAL HIGH (ref 0.4–1.00)
Lab: 133 MMOL/L — ABNORMAL LOW (ref 137–147)
Lab: 146 mg/dL — ABNORMAL HIGH (ref 70–100)
Lab: 17 mg/dL — ABNORMAL HIGH (ref 7–25)
Lab: 26 MMOL/L (ref 21–30)
Lab: 3.6 MMOL/L (ref 3.5–5.1)
Lab: 53 mL/min — ABNORMAL LOW (ref >60)
Lab: 8 pg (ref 3–12)
Lab: 9.9 mg/dL (ref 8.5–10.6)
Lab: 99 MMOL/L (ref 98–110)
Lab: >60 mL/min (ref >60)

## 2017-05-20 LAB — CBC: Lab: 15 10*3/uL — ABNORMAL HIGH (ref 4.5–11.0)

## 2017-05-20 MED ORDER — IMS MIXTURE TEMPLATE
125 ug | Freq: Every day | ORAL | 0 refills | Status: DC
Start: 2017-05-20 — End: 2017-05-26
  Administered 2017-05-21 – 2017-05-26 (×12): 125 ug via ORAL

## 2017-05-20 MED ORDER — ASPIRIN 81 MG PO TBEC
81 mg | Freq: Every evening | ORAL | 0 refills | Status: DC
Start: 2017-05-20 — End: 2017-05-26
  Administered 2017-05-21 – 2017-05-26 (×6): 81 mg via ORAL

## 2017-05-20 MED ORDER — LISINOPRIL 20 MG PO TAB
20 mg | Freq: Every day | ORAL | 0 refills | Status: DC
Start: 2017-05-20 — End: 2017-05-25

## 2017-05-20 MED ORDER — DIAZEPAM 10 MG PO TAB
10 mg | Freq: Once | ORAL | 0 refills | Status: CP | PRN
Start: 2017-05-20 — End: ?
  Administered 2017-05-21: 03:00:00 10 mg via ORAL

## 2017-05-20 MED ORDER — OXYCODONE 5 MG PO TAB
5 mg | ORAL | 0 refills | Status: DC | PRN
Start: 2017-05-20 — End: 2017-05-26
  Administered 2017-05-21 – 2017-05-26 (×5): 5 mg via ORAL

## 2017-05-20 MED ORDER — ACETAMINOPHEN 325 MG PO TAB
650 mg | ORAL | 0 refills | Status: DC
Start: 2017-05-20 — End: 2017-05-26
  Administered 2017-05-20 – 2017-05-26 (×15): 650 mg via ORAL

## 2017-05-20 MED ORDER — METFORMIN 500 MG PO TAB
500 mg | Freq: Two times a day (BID) | ORAL | 0 refills | Status: DC
Start: 2017-05-20 — End: 2017-05-26
  Administered 2017-05-21 – 2017-05-26 (×12): 500 mg via ORAL

## 2017-05-20 MED ORDER — VENLAFAXINE 75 MG PO CP24
75 mg | Freq: Every day | ORAL | 0 refills | Status: DC
Start: 2017-05-20 — End: 2017-05-26
  Administered 2017-05-21 – 2017-05-26 (×6): 75 mg via ORAL

## 2017-05-20 MED ORDER — DIPHENHYDRAMINE HCL 25 MG PO CAP
25 mg | ORAL | 0 refills | Status: DC | PRN
Start: 2017-05-20 — End: 2017-05-26

## 2017-05-20 MED ORDER — ACETAMINOPHEN 650 MG RE SUPP
650 mg | RECTAL | 0 refills | Status: DC
Start: 2017-05-20 — End: 2017-05-26

## 2017-05-20 MED ORDER — CETIRIZINE 10 MG PO TAB
10 mg | Freq: Every day | ORAL | 0 refills | Status: DC
Start: 2017-05-20 — End: 2017-05-26
  Administered 2017-05-21 – 2017-05-26 (×6): 10 mg via ORAL

## 2017-05-20 MED ORDER — GADOBENATE DIMEGLUMINE 529 MG/ML (0.1MMOL/0.2ML) IV SOLN
20 mL | Freq: Once | INTRAVENOUS | 0 refills | Status: CP
Start: 2017-05-20 — End: ?
  Administered 2017-05-21: 04:00:00 20 mL via INTRAVENOUS

## 2017-05-20 MED ORDER — DIPHENHYDRAMINE HCL 50 MG/ML IJ SOLN
25 mg | INTRAVENOUS | 0 refills | Status: DC | PRN
Start: 2017-05-20 — End: 2017-05-26

## 2017-05-20 MED ORDER — PANTOPRAZOLE 40 MG PO TBEC
40 mg | Freq: Two times a day (BID) | ORAL | 0 refills | Status: DC
Start: 2017-05-20 — End: 2017-05-26
  Administered 2017-05-21 – 2017-05-26 (×12): 40 mg via ORAL

## 2017-05-20 MED ORDER — MAGNESIUM HYDROXIDE 2,400 MG/10 ML PO SUSP
10 mL | Freq: Every day | ORAL | 0 refills | Status: DC
Start: 2017-05-20 — End: 2017-05-26
  Administered 2017-05-23: 03:00:00 10 mL via ORAL

## 2017-05-20 MED ORDER — ONDANSETRON HCL (PF) 4 MG/2 ML IJ SOLN
4 mg | INTRAVENOUS | 0 refills | Status: DC | PRN
Start: 2017-05-20 — End: 2017-05-26

## 2017-05-20 MED ORDER — DOCUSATE SODIUM 100 MG PO CAP
100 mg | Freq: Two times a day (BID) | ORAL | 0 refills | Status: DC
Start: 2017-05-20 — End: 2017-05-26
  Administered 2017-05-22 – 2017-05-26 (×3): 100 mg via ORAL

## 2017-05-20 MED ORDER — SITAGLIPTIN 100 MG PO TAB
100 mg | Freq: Every day | ORAL | 0 refills | Status: DC
Start: 2017-05-20 — End: 2017-05-26
  Administered 2017-05-21 – 2017-05-26 (×6): 100 mg via ORAL

## 2017-05-21 ENCOUNTER — Encounter: Admit: 2017-05-21 | Discharge: 2017-05-21 | Payer: BC Managed Care – PPO

## 2017-05-21 DIAGNOSIS — I10 Essential (primary) hypertension: ICD-10-CM

## 2017-05-21 DIAGNOSIS — E119 Type 2 diabetes mellitus without complications: ICD-10-CM

## 2017-05-21 DIAGNOSIS — M1990 Unspecified osteoarthritis, unspecified site: Secondary | ICD-10-CM

## 2017-05-21 DIAGNOSIS — E785 Hyperlipidemia, unspecified: ICD-10-CM

## 2017-05-21 DIAGNOSIS — F99 Mental disorder, not otherwise specified: ICD-10-CM

## 2017-05-21 DIAGNOSIS — K929 Disease of digestive system, unspecified: ICD-10-CM

## 2017-05-21 DIAGNOSIS — E079 Disorder of thyroid, unspecified: ICD-10-CM

## 2017-05-21 LAB — POC GLUCOSE
Lab: 117 mg/dL — ABNORMAL HIGH (ref 70–100)
Lab: 128 mg/dL — ABNORMAL HIGH (ref 70–100)
Lab: 136 mg/dL — ABNORMAL HIGH (ref 70–100)
Lab: 152 mg/dL — ABNORMAL HIGH (ref 70–100)

## 2017-05-21 LAB — BASIC METABOLIC PANEL
Lab: 101 MMOL/L — ABNORMAL LOW (ref 98–110)
Lab: 134 MMOL/L — ABNORMAL LOW (ref 137–147)
Lab: 3.6 MMOL/L — ABNORMAL HIGH (ref 3.5–5.1)

## 2017-05-21 LAB — CBC: Lab: 14 K/UL — ABNORMAL HIGH (ref >60)

## 2017-05-21 MED ORDER — CLINDAMYCIN IN 5 % DEXTROSE 900 MG/50 ML IV PGBK
900 mg | INTRAVENOUS | 0 refills | Status: CP
Start: 2017-05-21 — End: ?
  Administered 2017-05-21 – 2017-05-25 (×13): 900 mg via INTRAVENOUS

## 2017-05-21 MED ORDER — SODIUM CHLORIDE 0.9 % IV SOLP
INTRAVENOUS | 0 refills | Status: AC
Start: 2017-05-21 — End: ?
  Administered 2017-05-21 – 2017-05-23 (×2): 1000.000 mL via INTRAVENOUS

## 2017-05-21 MED ORDER — VANCOMYCIN 1,500 MG IVPB
1500 mg | Freq: Two times a day (BID) | INTRAVENOUS | 0 refills | Status: DC
Start: 2017-05-21 — End: 2017-05-23
  Administered 2017-05-21 – 2017-05-22 (×6): 1500 mg via INTRAVENOUS

## 2017-05-21 MED ORDER — VANCOMYCIN PHARMACY TO MANAGE
1 | 0 refills | Status: DC
Start: 2017-05-21 — End: 2017-05-23

## 2017-05-21 MED ORDER — PIPERACILLIN/TAZOBACTAM 3.375 G/NS IVPB (MB+)
3.375 g | INTRAVENOUS | 0 refills | Status: DC
Start: 2017-05-21 — End: 2017-05-26
  Administered 2017-05-21 – 2017-05-26 (×40): 3.375 g via INTRAVENOUS

## 2017-05-22 ENCOUNTER — Encounter: Admit: 2017-05-22 | Discharge: 2017-05-22 | Payer: BC Managed Care – PPO

## 2017-05-22 DIAGNOSIS — M869 Osteomyelitis, unspecified: ICD-10-CM

## 2017-05-22 LAB — CBC: Lab: 10 K/UL — ABNORMAL LOW (ref 4.5–11.0)

## 2017-05-22 LAB — BASIC METABOLIC PANEL: Lab: 136 MMOL/L — ABNORMAL LOW (ref >60)

## 2017-05-22 LAB — POC GLUCOSE
Lab: 147 mg/dL — ABNORMAL HIGH (ref 70–100)
Lab: 117 mg/dL — ABNORMAL HIGH (ref 70–100)
Lab: 117 mg/dL — ABNORMAL HIGH (ref 70–100)

## 2017-05-22 MED ORDER — LACTATED RINGERS IV SOLP
INTRAVENOUS | 0 refills | Status: AC
Start: 2017-05-22 — End: ?

## 2017-05-22 MED ORDER — VANCOMYCIN PHARMACY TO MANAGE
1 | 0 refills | Status: DC
Start: 2017-05-22 — End: 2017-05-26

## 2017-05-22 MED ORDER — VANCOMYCIN 1,500 MG IVPB
1500 mg | Freq: Two times a day (BID) | INTRAVENOUS | 0 refills | Status: DC
Start: 2017-05-22 — End: 2017-05-26
  Administered 2017-05-23 – 2017-05-26 (×14): 1500 mg via INTRAVENOUS

## 2017-05-22 MED ORDER — CELECOXIB 200 MG PO CAP
200 mg | Freq: Two times a day (BID) | ORAL | 0 refills | Status: DC
Start: 2017-05-22 — End: 2017-05-26
  Administered 2017-05-22 – 2017-05-26 (×9): 200 mg via ORAL

## 2017-05-22 MED ORDER — VANCOMYCIN 1G/250ML D5W IVPB (VIAL2BAG)
1 g | Freq: Once | INTRAVENOUS | 0 refills | Status: CP
Start: 2017-05-22 — End: ?
  Administered 2017-05-23 (×2): 1000 mg via INTRAVENOUS

## 2017-05-22 MED ORDER — ESTRADIOL 1 MG PO TAB
1 mg | Freq: Every day | ORAL | 0 refills | Status: DC
Start: 2017-05-22 — End: 2017-05-26
  Administered 2017-05-23 – 2017-05-26 (×4): 1 mg via ORAL

## 2017-05-23 ENCOUNTER — Inpatient Hospital Stay: Admit: 2017-05-23 | Discharge: 2017-05-23 | Payer: BC Managed Care – PPO

## 2017-05-23 LAB — BASIC METABOLIC PANEL: Lab: 141 MMOL/L — ABNORMAL LOW (ref >60)

## 2017-05-23 LAB — CBC: Lab: 9.7 K/UL — ABNORMAL LOW (ref >60)

## 2017-05-23 LAB — POC GLUCOSE
Lab: 116 mg/dL — ABNORMAL HIGH (ref 70–100)
Lab: 122 mg/dL — ABNORMAL HIGH (ref 70–100)
Lab: 154 mg/dL — ABNORMAL HIGH (ref 70–100)

## 2017-05-23 MED ORDER — BACITRACIN ZINC 500 UNIT/GRAM TP OINT
Freq: Three times a day (TID) | TOPICAL | 0 refills | Status: DC
Start: 2017-05-23 — End: 2017-05-26
  Administered 2017-05-24: 05:00:00 via TOPICAL

## 2017-05-24 LAB — BASIC METABOLIC PANEL: Lab: 141 MMOL/L — ABNORMAL LOW (ref 137–147)

## 2017-05-24 LAB — VANCOMYCIN TROUGH: Lab: 13 ug/mL (ref 10.0–20.0)

## 2017-05-24 LAB — POC GLUCOSE
Lab: 134 mg/dL — ABNORMAL HIGH (ref 70–100)
Lab: 103 mg/dL — ABNORMAL HIGH (ref 70–100)
Lab: 148 mg/dL — ABNORMAL HIGH (ref 70–100)
Lab: 125 mg/dL — ABNORMAL HIGH (ref 70–100)

## 2017-05-24 LAB — CBC: Lab: 7.6 K/UL — ABNORMAL LOW (ref >60)

## 2017-05-24 MED ORDER — MELATONIN 5 MG PO TAB
5 mg | Freq: Every evening | ORAL | 0 refills | Status: DC | PRN
Start: 2017-05-24 — End: 2017-05-26
  Administered 2017-05-25 – 2017-05-26 (×2): 5 mg via ORAL

## 2017-05-24 MED ORDER — ASPIRIN-ACETAMINOPHEN-CAFFEINE 250-250-65 MG PO TAB
1 | ORAL | 0 refills | Status: DC | PRN
Start: 2017-05-24 — End: 2017-05-26
  Administered 2017-05-24: 15:00:00 1 via ORAL

## 2017-05-25 LAB — BASIC METABOLIC PANEL: Lab: 140 MMOL/L — ABNORMAL LOW (ref >60)

## 2017-05-25 LAB — POC GLUCOSE
Lab: 159 mg/dL — ABNORMAL HIGH (ref 70–100)
Lab: 111 mg/dL — ABNORMAL HIGH (ref 70–100)

## 2017-05-25 LAB — CBC: Lab: 8.9 10*3/uL — ABNORMAL LOW (ref 4.5–11.0)

## 2017-05-25 MED ORDER — POTASSIUM CHLORIDE 20 MEQ PO TBTQ
60 meq | Freq: Once | ORAL | 0 refills | Status: CP
Start: 2017-05-25 — End: ?
  Administered 2017-05-25: 19:00:00 60 meq via ORAL

## 2017-05-25 MED ADMIN — SODIUM CHLORIDE 0.9 % IV SOLP [27838]: 250 mL | INTRAVENOUS | @ 19:00:00 | Stop: 2017-05-25 | NDC 00338004902

## 2017-05-26 ENCOUNTER — Encounter: Admit: 2017-05-26 | Discharge: 2017-05-26 | Payer: BC Managed Care – PPO

## 2017-05-26 ENCOUNTER — Inpatient Hospital Stay
Admit: 2017-05-20 | Discharge: 2017-05-26 | Disposition: A | Discharge status: Home / Self Care | Payer: BC Managed Care – PPO

## 2017-05-26 ENCOUNTER — Inpatient Hospital Stay: Admit: 2017-05-20 | Discharge: 2017-05-20 | Payer: BC Managed Care – PPO

## 2017-05-26 DIAGNOSIS — L03116 Cellulitis of left lower limb: ICD-10-CM

## 2017-05-26 DIAGNOSIS — T8149XA Infection following a procedure, other surgical site, initial encounter: Principal | ICD-10-CM

## 2017-05-26 DIAGNOSIS — M868X7 Other osteomyelitis, ankle and foot: ICD-10-CM

## 2017-05-26 DIAGNOSIS — B954 Other streptococcus as the cause of diseases classified elsewhere: ICD-10-CM

## 2017-05-26 DIAGNOSIS — E119 Type 2 diabetes mellitus without complications: ICD-10-CM

## 2017-05-26 DIAGNOSIS — R7989 Other specified abnormal findings of blood chemistry: ICD-10-CM

## 2017-05-26 DIAGNOSIS — Z6841 Body Mass Index (BMI) 40.0 and over, adult: ICD-10-CM

## 2017-05-26 DIAGNOSIS — I10 Essential (primary) hypertension: ICD-10-CM

## 2017-05-26 DIAGNOSIS — E785 Hyperlipidemia, unspecified: ICD-10-CM

## 2017-05-26 DIAGNOSIS — A46 Erysipelas: ICD-10-CM

## 2017-05-26 LAB — POC GLUCOSE
Lab: 101 mg/dL — ABNORMAL HIGH (ref 70–100)
Lab: 130 mg/dL — ABNORMAL HIGH (ref 70–100)
Lab: 110 mg/dL — ABNORMAL HIGH (ref 70–100)

## 2017-05-26 LAB — VANCOMYCIN TROUGH: Lab: 16 ug/mL (ref 10.0–20.0)

## 2017-05-26 LAB — CBC: Lab: 9.8 K/UL — ABNORMAL LOW (ref 4.5–11.0)

## 2017-05-26 LAB — BASIC METABOLIC PANEL
Lab: 107 MMOL/L — ABNORMAL LOW (ref 98–110)
Lab: 142 MMOL/L — ABNORMAL LOW (ref 137–147)

## 2017-05-26 MED ORDER — OXYCODONE 5 MG PO TAB
5-10 mg | ORAL_TABLET | ORAL | 0 refills | 6.00000 days | Status: AC | PRN
Start: 2017-05-26 — End: 2017-09-22
  Filled 2017-05-26 (×2): qty 50, 5d supply, fill #1

## 2017-05-26 MED ORDER — PIPERACILLIN/TAZOBACTAM 4.5 G/NS IVPB (MB+)
13.5 g | INTRAVENOUS | 0 refills | Status: AC
Start: 2017-05-26 — End: ?

## 2017-05-26 MED ORDER — PIPERACILLIN/TAZOBACTAM 4.5 G/NS IVPB (MB+)
4.5 g | INTRAVENOUS | 0 refills | Status: DC
Start: 2017-05-26 — End: 2017-05-26

## 2017-05-26 MED ORDER — VANCOMYCIN 1,500 MG IVPB
1500 mg | Freq: Two times a day (BID) | INTRAVENOUS | 0 refills | 10.00000 days | Status: AC
Start: 2017-05-26 — End: ?

## 2017-05-26 MED ORDER — ACETAMINOPHEN 325 MG PO TAB
650 mg | ORAL | 0 refills | Status: AC
Start: 2017-05-26 — End: 2017-07-14

## 2017-05-26 MED ORDER — DOCUSATE SODIUM 100 MG PO CAP
100 mg | ORAL_CAPSULE | Freq: Two times a day (BID) | ORAL | 3 refills | Status: AC | PRN
Start: 2017-05-26 — End: 2017-07-14

## 2017-05-27 LAB — CULTURE-WOUND/TISSUE/FLUID(AEROBIC ONLY)W/SENSITIVITY

## 2017-05-27 LAB — CULTURE-ANAEROBIC

## 2017-05-28 ENCOUNTER — Encounter: Admit: 2017-05-28 | Discharge: 2017-05-28 | Payer: BC Managed Care – PPO

## 2017-05-28 DIAGNOSIS — Z792 Long term (current) use of antibiotics: Principal | ICD-10-CM

## 2017-05-29 ENCOUNTER — Encounter: Admit: 2017-05-29 | Discharge: 2017-05-29 | Payer: BC Managed Care – PPO

## 2017-05-29 DIAGNOSIS — Z792 Long term (current) use of antibiotics: Principal | ICD-10-CM

## 2017-05-30 LAB — VANCOMYCIN TROUGH: Lab: 18

## 2017-06-01 ENCOUNTER — Encounter: Admit: 2017-06-01 | Discharge: 2017-06-01 | Payer: BC Managed Care – PPO

## 2017-06-01 LAB — ALT (SGPT): Lab: 15

## 2017-06-01 LAB — BUN: Lab: 17

## 2017-06-01 LAB — AST (SGOT): Lab: 21

## 2017-06-01 LAB — ALK PHOS TOTAL: Lab: 79

## 2017-06-01 LAB — CREATININE: Lab: 0.9

## 2017-06-01 LAB — POTASSIUM: Lab: 4.2

## 2017-06-01 LAB — PLATELET COUNT: Lab: 323

## 2017-06-01 LAB — HEMOGLOBIN: Lab: 12

## 2017-06-01 LAB — VANCOMYCIN TROUGH: Lab: 12

## 2017-06-01 LAB — CBC: Lab: 7.4

## 2017-06-02 ENCOUNTER — Encounter: Admit: 2017-06-02 | Discharge: 2017-06-02 | Payer: BC Managed Care – PPO

## 2017-06-05 ENCOUNTER — Encounter: Admit: 2017-06-05 | Discharge: 2017-06-05 | Payer: BC Managed Care – PPO

## 2017-06-08 LAB — BUN: Lab: 19

## 2017-06-08 LAB — ALT (SGPT): Lab: 20

## 2017-06-08 LAB — CREATININE: Lab: 0.8

## 2017-06-08 LAB — POTASSIUM: Lab: 4.2

## 2017-06-08 LAB — CBC: Lab: 5.4

## 2017-06-08 LAB — VANCOMYCIN TROUGH: Lab: 18

## 2017-06-08 LAB — HEMOGLOBIN: Lab: 13

## 2017-06-08 LAB — AST (SGOT): Lab: 28

## 2017-06-08 LAB — ALK PHOS TOTAL: Lab: 69

## 2017-06-08 LAB — PLATELET COUNT: Lab: 275

## 2017-06-09 ENCOUNTER — Ambulatory Visit: Admit: 2017-06-09 | Discharge: 2017-06-10 | Payer: BC Managed Care – PPO

## 2017-06-09 ENCOUNTER — Ambulatory Visit: Admit: 2017-06-09 | Discharge: 2017-06-09 | Payer: BC Managed Care – PPO

## 2017-06-09 ENCOUNTER — Encounter: Admit: 2017-06-09 | Discharge: 2017-06-09 | Payer: BC Managed Care – PPO

## 2017-06-09 DIAGNOSIS — Z452 Encounter for adjustment and management of vascular access device: Principal | ICD-10-CM

## 2017-06-09 DIAGNOSIS — F99 Mental disorder, not otherwise specified: ICD-10-CM

## 2017-06-09 DIAGNOSIS — Z792 Long term (current) use of antibiotics: ICD-10-CM

## 2017-06-09 DIAGNOSIS — E079 Disorder of thyroid, unspecified: ICD-10-CM

## 2017-06-09 DIAGNOSIS — A499 Bacterial infection, unspecified: Principal | ICD-10-CM

## 2017-06-09 DIAGNOSIS — M19072 Primary osteoarthritis, left ankle and foot: ICD-10-CM

## 2017-06-09 DIAGNOSIS — I10 Essential (primary) hypertension: ICD-10-CM

## 2017-06-09 DIAGNOSIS — E785 Hyperlipidemia, unspecified: ICD-10-CM

## 2017-06-09 DIAGNOSIS — A46 Erysipelas: ICD-10-CM

## 2017-06-09 DIAGNOSIS — S91002D Unspecified open wound, left ankle, subsequent encounter: Principal | ICD-10-CM

## 2017-06-09 DIAGNOSIS — E119 Type 2 diabetes mellitus without complications: ICD-10-CM

## 2017-06-09 DIAGNOSIS — K929 Disease of digestive system, unspecified: ICD-10-CM

## 2017-06-09 DIAGNOSIS — M1990 Unspecified osteoarthritis, unspecified site: Principal | ICD-10-CM

## 2017-06-10 ENCOUNTER — Encounter: Admit: 2017-06-10 | Discharge: 2017-06-10 | Payer: BC Managed Care – PPO

## 2017-06-10 DIAGNOSIS — Z792 Long term (current) use of antibiotics: Principal | ICD-10-CM

## 2017-06-11 LAB — VANCOMYCIN TROUGH: Lab: 19

## 2017-06-11 LAB — BUN: Lab: 18

## 2017-06-11 LAB — CREATININE: Lab: 0.8

## 2017-06-11 LAB — POTASSIUM: Lab: 4

## 2017-06-12 ENCOUNTER — Encounter: Admit: 2017-06-12 | Discharge: 2017-06-12 | Payer: BC Managed Care – PPO

## 2017-06-15 ENCOUNTER — Encounter: Admit: 2017-06-15 | Discharge: 2017-06-15 | Payer: BC Managed Care – PPO

## 2017-06-15 DIAGNOSIS — Z792 Long term (current) use of antibiotics: Principal | ICD-10-CM

## 2017-06-15 LAB — POTASSIUM: Lab: 4 %

## 2017-06-15 LAB — BUN: Lab: 18

## 2017-06-15 LAB — VANCOMYCIN TROUGH: Lab: 19 {cells}/uL (ref 15–500)

## 2017-06-15 LAB — HEMOGLOBIN: Lab: 12 {cells}/uL (ref <=5)

## 2017-06-15 LAB — AST (SGOT): Lab: 30 %

## 2017-06-15 LAB — CREATININE: Lab: 0.8 % (ref 10–40)

## 2017-06-15 LAB — CBC: Lab: 5.6 {cells}/uL (ref <=2)

## 2017-06-15 LAB — PLATELET COUNT: Lab: 207 {cells}/uL (ref <=5)

## 2017-06-15 LAB — ALK PHOS TOTAL: Lab: 64 % (ref 9–46)

## 2017-06-15 LAB — ALT (SGPT): Lab: 21 %

## 2017-06-22 ENCOUNTER — Encounter: Admit: 2017-06-22 | Discharge: 2017-06-22 | Payer: BC Managed Care – PPO

## 2017-06-22 LAB — AST (SGOT): Lab: 41

## 2017-06-22 LAB — SED RATE: Lab: 14

## 2017-06-22 LAB — CULTURE-FUNGAL,OTHER

## 2017-06-22 LAB — POTASSIUM: Lab: 4.3

## 2017-06-22 LAB — CREATININE: Lab: 0.7

## 2017-06-22 LAB — CREATINE KINASE-CPK: Lab: 51

## 2017-06-22 LAB — ALT (SGPT): Lab: 30

## 2017-06-22 LAB — PLATELET COUNT: Lab: 190

## 2017-06-22 LAB — BUN: Lab: 20

## 2017-06-22 LAB — VANCOMYCIN TROUGH: Lab: 20

## 2017-06-22 LAB — CBC: Lab: 5.7

## 2017-06-22 LAB — ALK PHOS TOTAL: Lab: 70

## 2017-06-22 LAB — HEMOGLOBIN: Lab: 13

## 2017-06-23 ENCOUNTER — Encounter: Admit: 2017-06-23 | Discharge: 2017-06-23 | Payer: BC Managed Care – PPO

## 2017-06-28 LAB — AST (SGOT): Lab: 59

## 2017-06-28 LAB — BUN: Lab: 17

## 2017-06-28 LAB — ALT (SGPT): Lab: 44

## 2017-06-29 ENCOUNTER — Encounter: Admit: 2017-06-29 | Discharge: 2017-06-29 | Payer: BC Managed Care – PPO

## 2017-06-30 ENCOUNTER — Encounter: Admit: 2017-06-30 | Discharge: 2017-06-30 | Payer: BC Managed Care – PPO

## 2017-06-30 ENCOUNTER — Ambulatory Visit: Admit: 2017-06-30 | Discharge: 2017-07-01 | Payer: BC Managed Care – PPO

## 2017-06-30 ENCOUNTER — Ambulatory Visit: Admit: 2017-06-30 | Discharge: 2017-06-30 | Payer: BC Managed Care – PPO

## 2017-06-30 DIAGNOSIS — I10 Essential (primary) hypertension: ICD-10-CM

## 2017-06-30 DIAGNOSIS — M86379 Chronic multifocal osteomyelitis, unspecified ankle and foot: Principal | ICD-10-CM

## 2017-06-30 DIAGNOSIS — E785 Hyperlipidemia, unspecified: ICD-10-CM

## 2017-06-30 DIAGNOSIS — A499 Bacterial infection, unspecified: ICD-10-CM

## 2017-06-30 DIAGNOSIS — M1990 Unspecified osteoarthritis, unspecified site: Secondary | ICD-10-CM

## 2017-06-30 DIAGNOSIS — F99 Mental disorder, not otherwise specified: ICD-10-CM

## 2017-06-30 DIAGNOSIS — E119 Type 2 diabetes mellitus without complications: ICD-10-CM

## 2017-06-30 DIAGNOSIS — E079 Disorder of thyroid, unspecified: ICD-10-CM

## 2017-06-30 DIAGNOSIS — Z792 Long term (current) use of antibiotics: ICD-10-CM

## 2017-06-30 DIAGNOSIS — K929 Disease of digestive system, unspecified: ICD-10-CM

## 2017-06-30 LAB — CBC AND DIFF
Lab: 0.1 10*3/uL (ref 0–0.20)
Lab: 0.4 10*3/uL (ref 0–0.45)
Lab: 0.5 10*3/uL (ref 0–0.80)
Lab: 1 % (ref 0–2)
Lab: 1.9 10*3/uL (ref 1.0–4.8)
Lab: 10 FL (ref 7–11)
Lab: 12 g/dL (ref 12.0–15.0)
Lab: 14 % — ABNORMAL HIGH (ref 11–15)
Lab: 213 10*3/uL (ref 150–400)
Lab: 29 % (ref >60)
Lab: 29 pg (ref 26–34)
Lab: 3.8 10*3/uL (ref 1.8–7.0)
Lab: 33 g/dL (ref 32.0–36.0)
Lab: 38 % (ref 36–45)
Lab: 4.3 M/UL (ref 4.0–5.0)
Lab: 5 % (ref 0–5)
Lab: 58 % (ref 41–77)
Lab: 6.7 10*3/uL (ref 4.5–11.0)
Lab: 7 % (ref >60)
Lab: 89 FL — ABNORMAL LOW (ref 80–100)

## 2017-06-30 LAB — COMPREHENSIVE METABOLIC PANEL
Lab: 115 mg/dL — ABNORMAL HIGH (ref 70–100)
Lab: 138 MMOL/L (ref 137–147)
Lab: 3.9 MMOL/L (ref 3.5–5.1)

## 2017-06-30 LAB — SED RATE: Lab: 31 mm/h — ABNORMAL HIGH (ref 0–30)

## 2017-06-30 LAB — VANCOMYCIN TROUGH: Lab: 18 ug/mL (ref 10.0–20.0)

## 2017-07-01 ENCOUNTER — Encounter: Admit: 2017-07-01 | Discharge: 2017-07-01 | Payer: BC Managed Care – PPO

## 2017-07-01 DIAGNOSIS — Z792 Long term (current) use of antibiotics: Principal | ICD-10-CM

## 2017-07-01 DIAGNOSIS — M86379 Chronic multifocal osteomyelitis, unspecified ankle and foot: Principal | ICD-10-CM

## 2017-07-02 ENCOUNTER — Encounter: Admit: 2017-07-02 | Discharge: 2017-07-02 | Payer: BC Managed Care – PPO

## 2017-07-02 DIAGNOSIS — I10 Essential (primary) hypertension: ICD-10-CM

## 2017-07-02 DIAGNOSIS — E079 Disorder of thyroid, unspecified: ICD-10-CM

## 2017-07-02 DIAGNOSIS — K929 Disease of digestive system, unspecified: ICD-10-CM

## 2017-07-02 DIAGNOSIS — F99 Mental disorder, not otherwise specified: ICD-10-CM

## 2017-07-02 DIAGNOSIS — E785 Hyperlipidemia, unspecified: ICD-10-CM

## 2017-07-02 DIAGNOSIS — E119 Type 2 diabetes mellitus without complications: ICD-10-CM

## 2017-07-02 DIAGNOSIS — M1990 Unspecified osteoarthritis, unspecified site: Secondary | ICD-10-CM

## 2017-07-03 ENCOUNTER — Encounter: Admit: 2017-07-03 | Discharge: 2017-07-03 | Payer: BC Managed Care – PPO

## 2017-07-03 LAB — POTASSIUM: Lab: 4

## 2017-07-03 LAB — VANCOMYCIN TROUGH: Lab: 23

## 2017-07-03 LAB — SED RATE: Lab: 13

## 2017-07-03 LAB — CREATININE: Lab: 0.9

## 2017-07-03 LAB — BUN: Lab: 22

## 2017-07-03 LAB — CREATINE KINASE-CPK: Lab: 66

## 2017-07-06 ENCOUNTER — Encounter: Admit: 2017-07-06 | Discharge: 2017-07-06 | Payer: BC Managed Care – PPO

## 2017-07-07 ENCOUNTER — Encounter: Admit: 2017-07-07 | Discharge: 2017-07-07 | Payer: BC Managed Care – PPO

## 2017-07-07 ENCOUNTER — Ambulatory Visit: Admit: 2017-07-07 | Discharge: 2017-07-07 | Payer: BC Managed Care – PPO

## 2017-07-07 ENCOUNTER — Ambulatory Visit: Admit: 2017-07-07 | Discharge: 2017-07-08 | Payer: BC Managed Care – PPO

## 2017-07-07 DIAGNOSIS — M86379 Chronic multifocal osteomyelitis, unspecified ankle and foot: Principal | ICD-10-CM

## 2017-07-07 LAB — AST (SGOT): Lab: 53

## 2017-07-07 LAB — BUN: Lab: 21

## 2017-07-07 LAB — PLATELET COUNT: Lab: 225

## 2017-07-07 LAB — HEMOGLOBIN: Lab: 12

## 2017-07-07 LAB — SED RATE: Lab: 17

## 2017-07-07 LAB — POTASSIUM: Lab: 4

## 2017-07-07 LAB — ALT (SGPT): Lab: 45

## 2017-07-07 LAB — CBC: Lab: 6.9

## 2017-07-07 LAB — CREATININE: Lab: 0.9

## 2017-07-07 LAB — CREATINE KINASE-CPK: Lab: 61

## 2017-07-07 LAB — VANCOMYCIN TROUGH: Lab: 26

## 2017-07-07 LAB — ALK PHOS TOTAL: Lab: 73

## 2017-07-07 MED ORDER — LIDOCAINE HCL 10 MG/ML (1 %) IJ SOLN
1 mL | Freq: Once | INTRAMUSCULAR | 0 refills | Status: CP
Start: 2017-07-07 — End: ?
  Administered 2017-07-07: 18:00:00 1 mL via INTRAMUSCULAR

## 2017-07-07 MED ORDER — IOPAMIDOL 76 % IV SOLN
100 mL | Freq: Once | INTRA_ARTICULAR | 0 refills | Status: CP
Start: 2017-07-07 — End: ?
  Administered 2017-07-07: 18:00:00 3 mL via INTRA_ARTICULAR

## 2017-07-07 MED ORDER — GADOBENATE DIMEGLUMINE 529 MG/ML (0.1MMOL/0.2ML) IV SOLN
10 mL | Freq: Once | INTRA_ARTICULAR | 0 refills | Status: CP
Start: 2017-07-07 — End: ?
  Administered 2017-07-07: 18:00:00 0.05 mL via INTRA_ARTICULAR

## 2017-07-07 MED ORDER — SODIUM CHLORIDE 0.9 % IJ SOLN
3 mL | Freq: Once | INTRA_ARTICULAR | 0 refills | Status: CP
Start: 2017-07-07 — End: ?
  Administered 2017-07-07: 18:00:00 3 mL via INTRA_ARTICULAR

## 2017-07-09 ENCOUNTER — Encounter: Admit: 2017-07-09 | Discharge: 2017-07-09 | Payer: BC Managed Care – PPO

## 2017-07-09 DIAGNOSIS — M86379 Chronic multifocal osteomyelitis, unspecified ankle and foot: Principal | ICD-10-CM

## 2017-07-10 LAB — CREATININE: Lab: 0.8

## 2017-07-10 LAB — VANCOMYCIN TROUGH: Lab: 20

## 2017-07-13 ENCOUNTER — Encounter: Admit: 2017-07-13 | Discharge: 2017-07-13 | Payer: BC Managed Care – PPO

## 2017-07-13 LAB — BUN: Lab: 15

## 2017-07-13 LAB — CREATINE KINASE-CPK: Lab: 61

## 2017-07-13 LAB — ALK PHOS TOTAL: Lab: 57

## 2017-07-13 LAB — AST (SGOT): Lab: 48

## 2017-07-13 LAB — VANCOMYCIN TROUGH: Lab: 17

## 2017-07-13 LAB — SED RATE: Lab: 18

## 2017-07-13 LAB — CBC: Lab: 6.4

## 2017-07-13 LAB — POTASSIUM: Lab: 4.1

## 2017-07-13 LAB — CREATININE: Lab: 0.8

## 2017-07-13 LAB — PLATELET COUNT: Lab: 208

## 2017-07-13 LAB — ALT (SGPT): Lab: 40

## 2017-07-13 LAB — HEMOGLOBIN: Lab: 12

## 2017-07-14 ENCOUNTER — Ambulatory Visit: Admit: 2017-07-14 | Discharge: 2017-07-14 | Payer: BC Managed Care – PPO

## 2017-07-14 ENCOUNTER — Encounter: Admit: 2017-07-14 | Discharge: 2017-07-14 | Payer: BC Managed Care – PPO

## 2017-07-14 DIAGNOSIS — F99 Mental disorder, not otherwise specified: ICD-10-CM

## 2017-07-14 DIAGNOSIS — E119 Type 2 diabetes mellitus without complications: ICD-10-CM

## 2017-07-14 DIAGNOSIS — E079 Disorder of thyroid, unspecified: ICD-10-CM

## 2017-07-14 DIAGNOSIS — E785 Hyperlipidemia, unspecified: ICD-10-CM

## 2017-07-14 DIAGNOSIS — M86262 Subacute osteomyelitis, left tibia and fibula: ICD-10-CM

## 2017-07-14 DIAGNOSIS — Z792 Long term (current) use of antibiotics: ICD-10-CM

## 2017-07-14 DIAGNOSIS — M86379 Chronic multifocal osteomyelitis, unspecified ankle and foot: Principal | ICD-10-CM

## 2017-07-14 DIAGNOSIS — S91002D Unspecified open wound, left ankle, subsequent encounter: Principal | ICD-10-CM

## 2017-07-14 DIAGNOSIS — K929 Disease of digestive system, unspecified: ICD-10-CM

## 2017-07-14 DIAGNOSIS — A46 Erysipelas: Principal | ICD-10-CM

## 2017-07-14 DIAGNOSIS — I10 Essential (primary) hypertension: ICD-10-CM

## 2017-07-14 DIAGNOSIS — M1990 Unspecified osteoarthritis, unspecified site: Principal | ICD-10-CM

## 2017-07-15 ENCOUNTER — Encounter: Admit: 2017-07-15 | Discharge: 2017-07-15 | Payer: BC Managed Care – PPO

## 2017-07-15 DIAGNOSIS — M86379 Chronic multifocal osteomyelitis, unspecified ankle and foot: Principal | ICD-10-CM

## 2017-07-15 LAB — POTASSIUM: Lab: 3.8

## 2017-07-15 LAB — CREATININE: Lab: 0.7

## 2017-07-15 LAB — BUN: Lab: 18

## 2017-07-15 LAB — VANCOMYCIN TROUGH: Lab: 26

## 2017-07-16 ENCOUNTER — Encounter: Admit: 2017-07-16 | Discharge: 2017-07-16 | Payer: BC Managed Care – PPO

## 2017-07-17 ENCOUNTER — Encounter: Admit: 2017-07-17 | Discharge: 2017-07-17 | Payer: BC Managed Care – PPO

## 2017-07-17 LAB — POTASSIUM: Lab: 4

## 2017-07-17 LAB — CREATININE: Lab: 0.8

## 2017-07-17 LAB — VANCOMYCIN TROUGH: Lab: 30

## 2017-07-17 LAB — BUN: Lab: 20

## 2017-07-20 ENCOUNTER — Encounter: Admit: 2017-07-20 | Discharge: 2017-07-20 | Payer: BC Managed Care – PPO

## 2017-07-20 DIAGNOSIS — M86379 Chronic multifocal osteomyelitis, unspecified ankle and foot: Principal | ICD-10-CM

## 2017-07-20 LAB — VANCOMYCIN TROUGH: Lab: 21

## 2017-07-20 LAB — POTASSIUM: Lab: 3.9

## 2017-07-20 LAB — PLATELET COUNT: Lab: 205

## 2017-07-20 LAB — HEMOGLOBIN: Lab: 13

## 2017-07-20 LAB — ALK PHOS TOTAL: Lab: 65

## 2017-07-20 LAB — AST (SGOT): Lab: 46

## 2017-07-20 LAB — CBC: Lab: 6.1

## 2017-07-20 LAB — ALT (SGPT): Lab: 37

## 2017-07-20 LAB — CREATININE: Lab: 0.8

## 2017-07-20 LAB — BUN: Lab: 17

## 2017-07-21 ENCOUNTER — Encounter: Admit: 2017-07-21 | Discharge: 2017-07-21 | Payer: BC Managed Care – PPO

## 2017-07-27 LAB — CREATININE: Lab: 0.8

## 2017-07-27 LAB — AST (SGOT): Lab: 36

## 2017-07-27 LAB — POTASSIUM: Lab: 4

## 2017-07-27 LAB — BUN: Lab: 20

## 2017-07-27 LAB — ALK PHOS TOTAL: Lab: 63

## 2017-07-27 LAB — VANCOMYCIN TROUGH: Lab: 16

## 2017-07-27 LAB — ALT (SGPT): Lab: 28

## 2017-07-27 LAB — CBC: Lab: 6.7

## 2017-07-27 LAB — HEMOGLOBIN: Lab: 13

## 2017-07-27 LAB — PLATELET COUNT: Lab: 199

## 2017-07-28 ENCOUNTER — Encounter: Admit: 2017-07-28 | Discharge: 2017-07-28 | Payer: BC Managed Care – PPO

## 2017-07-30 LAB — VANCOMYCIN TROUGH: Lab: 16

## 2017-07-30 LAB — CREATININE: Lab: 0.7

## 2017-07-30 LAB — BUN: Lab: 14

## 2017-07-30 LAB — POTASSIUM: Lab: 4

## 2017-07-31 ENCOUNTER — Encounter: Admit: 2017-07-31 | Discharge: 2017-07-31 | Payer: BC Managed Care – PPO

## 2017-08-03 ENCOUNTER — Encounter: Admit: 2017-08-03 | Discharge: 2017-08-03 | Payer: BC Managed Care – PPO

## 2017-08-03 DIAGNOSIS — M86379 Chronic multifocal osteomyelitis, unspecified ankle and foot: Principal | ICD-10-CM

## 2017-08-03 LAB — VANCOMYCIN TROUGH: Lab: 14

## 2017-08-03 LAB — ALT (SGPT): Lab: 25

## 2017-08-03 LAB — CBC: Lab: 6.5

## 2017-08-03 LAB — BUN: Lab: 16

## 2017-08-03 LAB — SED RATE: Lab: 16

## 2017-08-03 LAB — HEMOGLOBIN: Lab: 12

## 2017-08-03 LAB — POTASSIUM: Lab: 3.7

## 2017-08-03 LAB — CREATININE: Lab: 0.7

## 2017-08-03 LAB — CREATINE KINASE-CPK: Lab: 52

## 2017-08-03 LAB — AST (SGOT): Lab: 32

## 2017-08-03 LAB — PLATELET COUNT: Lab: 193

## 2017-08-03 LAB — ALK PHOS TOTAL: Lab: 66

## 2017-08-04 ENCOUNTER — Encounter: Admit: 2017-08-04 | Discharge: 2017-08-04 | Payer: BC Managed Care – PPO

## 2017-08-07 ENCOUNTER — Encounter: Admit: 2017-08-07 | Discharge: 2017-08-07 | Payer: BC Managed Care – PPO

## 2017-08-10 LAB — CREATININE: Lab: 0.8

## 2017-08-10 LAB — CREATINE KINASE-CPK: Lab: 61

## 2017-08-10 LAB — CBC: Lab: 7

## 2017-08-10 LAB — BUN: Lab: 17

## 2017-08-10 LAB — VANCOMYCIN TROUGH: Lab: 14

## 2017-08-10 LAB — HEMOGLOBIN: Lab: 13

## 2017-08-10 LAB — AST (SGOT): Lab: 35

## 2017-08-10 LAB — ALT (SGPT): Lab: 24

## 2017-08-10 LAB — POTASSIUM: Lab: 4.1

## 2017-08-10 LAB — SED RATE: Lab: 15

## 2017-08-10 LAB — ALK PHOS TOTAL: Lab: 68

## 2017-08-10 LAB — PLATELET COUNT: Lab: 206

## 2017-08-11 ENCOUNTER — Ambulatory Visit: Admit: 2017-08-11 | Discharge: 2017-08-11 | Payer: BC Managed Care – PPO

## 2017-08-11 ENCOUNTER — Ambulatory Visit: Admit: 2017-08-11 | Discharge: 2017-08-12 | Payer: BC Managed Care – PPO

## 2017-08-11 ENCOUNTER — Encounter: Admit: 2017-08-11 | Discharge: 2017-08-11 | Payer: BC Managed Care – PPO

## 2017-08-11 DIAGNOSIS — M1990 Unspecified osteoarthritis, unspecified site: Principal | ICD-10-CM

## 2017-08-11 DIAGNOSIS — E079 Disorder of thyroid, unspecified: ICD-10-CM

## 2017-08-11 DIAGNOSIS — K929 Disease of digestive system, unspecified: ICD-10-CM

## 2017-08-11 DIAGNOSIS — I10 Essential (primary) hypertension: ICD-10-CM

## 2017-08-11 DIAGNOSIS — E785 Hyperlipidemia, unspecified: ICD-10-CM

## 2017-08-11 DIAGNOSIS — F99 Mental disorder, not otherwise specified: ICD-10-CM

## 2017-08-11 DIAGNOSIS — Z792 Long term (current) use of antibiotics: Principal | ICD-10-CM

## 2017-08-11 DIAGNOSIS — E119 Type 2 diabetes mellitus without complications: ICD-10-CM

## 2017-08-11 DIAGNOSIS — M869 Osteomyelitis, unspecified: ICD-10-CM

## 2017-08-11 DIAGNOSIS — A499 Bacterial infection, unspecified: ICD-10-CM

## 2017-08-11 DIAGNOSIS — S91002S Unspecified open wound, left ankle, sequela: Principal | ICD-10-CM

## 2017-08-12 ENCOUNTER — Encounter: Admit: 2017-08-12 | Discharge: 2017-08-12 | Payer: BC Managed Care – PPO

## 2017-08-12 DIAGNOSIS — Z792 Long term (current) use of antibiotics: Principal | ICD-10-CM

## 2017-08-17 LAB — VANCOMYCIN TROUGH: Lab: 12

## 2017-08-17 LAB — HEMOGLOBIN: Lab: 13

## 2017-08-17 LAB — ALT (SGPT): Lab: 24

## 2017-08-17 LAB — PLATELET COUNT: Lab: 185

## 2017-08-17 LAB — BUN: Lab: 19

## 2017-08-17 LAB — POTASSIUM: Lab: 4.4

## 2017-08-17 LAB — CBC: Lab: 7.1

## 2017-08-17 LAB — SED RATE: Lab: 5

## 2017-08-17 LAB — AST (SGOT): Lab: 33

## 2017-08-17 LAB — ALK PHOS TOTAL: Lab: 64

## 2017-08-17 LAB — CREATININE: Lab: 0.8

## 2017-08-17 LAB — CREATINE KINASE-CPK: Lab: 48

## 2017-08-18 ENCOUNTER — Encounter: Admit: 2017-08-18 | Discharge: 2017-08-18 | Payer: BC Managed Care – PPO

## 2017-08-19 ENCOUNTER — Encounter: Admit: 2017-08-19 | Discharge: 2017-08-19 | Payer: BC Managed Care – PPO

## 2017-08-19 DIAGNOSIS — Z792 Long term (current) use of antibiotics: Principal | ICD-10-CM

## 2017-08-19 LAB — CULTURE-ANAEROBIC

## 2017-08-20 ENCOUNTER — Encounter: Admit: 2017-08-20 | Discharge: 2017-08-20 | Payer: BC Managed Care – PPO

## 2017-08-20 DIAGNOSIS — Z792 Long term (current) use of antibiotics: Principal | ICD-10-CM

## 2017-08-21 LAB — CULTURE-WOUND/TISSUE/FLUID(AEROBIC ONLY)W/SENSITIVITY

## 2017-08-25 ENCOUNTER — Encounter: Admit: 2017-08-25 | Discharge: 2017-08-25 | Payer: BC Managed Care – PPO

## 2017-08-25 LAB — ALT (SGPT): Lab: 22

## 2017-08-25 LAB — AST (SGOT): Lab: 42

## 2017-08-25 LAB — CBC: Lab: 5.8

## 2017-08-25 LAB — VANCOMYCIN TROUGH: Lab: 35

## 2017-08-25 LAB — POTASSIUM: Lab: 4.2

## 2017-08-25 LAB — BUN: Lab: 14

## 2017-08-25 LAB — ALK PHOS TOTAL: Lab: 60

## 2017-08-25 LAB — AEROBIC BACTERIA, SUSCEPT

## 2017-08-25 LAB — HEMOGLOBIN: Lab: 13

## 2017-08-25 LAB — SED RATE: Lab: 15

## 2017-08-25 LAB — CREATININE: Lab: 0.7

## 2017-08-25 LAB — PLATELET COUNT: Lab: 200

## 2017-08-26 ENCOUNTER — Encounter: Admit: 2017-08-26 | Discharge: 2017-08-26 | Payer: BC Managed Care – PPO

## 2017-08-27 ENCOUNTER — Encounter: Admit: 2017-08-27 | Discharge: 2017-08-27 | Payer: BC Managed Care – PPO

## 2017-08-27 LAB — AST (SGOT): Lab: 41

## 2017-08-27 LAB — ALK PHOS TOTAL: Lab: 71

## 2017-08-27 LAB — BUN: Lab: 21

## 2017-08-27 LAB — VANCOMYCIN TROUGH: Lab: 26

## 2017-08-27 LAB — HEMOGLOBIN: Lab: 13

## 2017-08-27 LAB — POTASSIUM: Lab: 3.9

## 2017-08-27 LAB — SED RATE: Lab: 16

## 2017-08-27 LAB — CREATININE: Lab: 0.7

## 2017-08-27 LAB — PLATELET COUNT: Lab: 200

## 2017-08-27 LAB — ALT (SGPT): Lab: 25

## 2017-08-27 LAB — CBC: Lab: 5.7

## 2017-08-31 ENCOUNTER — Encounter: Admit: 2017-08-31 | Discharge: 2017-08-31 | Payer: BC Managed Care – PPO

## 2017-08-31 DIAGNOSIS — B948 Sequelae of other specified infectious and parasitic diseases: Principal | ICD-10-CM

## 2017-08-31 DIAGNOSIS — B999 Unspecified infectious disease: ICD-10-CM

## 2017-08-31 LAB — CBC: Lab: 5.7

## 2017-08-31 LAB — HEMOGLOBIN: Lab: 13

## 2017-08-31 LAB — CREATININE: Lab: 0.8

## 2017-08-31 LAB — BUN: Lab: 15

## 2017-08-31 LAB — SED RATE: Lab: 15

## 2017-08-31 LAB — ALT (SGPT): Lab: 32

## 2017-08-31 LAB — AST (SGOT): Lab: 54

## 2017-08-31 LAB — VANCOMYCIN TROUGH: Lab: 19

## 2017-08-31 LAB — POTASSIUM: Lab: 3.8

## 2017-08-31 LAB — PLATELET COUNT: Lab: 193

## 2017-08-31 LAB — ALK PHOS TOTAL: Lab: 70

## 2017-09-01 MED ORDER — LINEZOLID 600 MG PO TAB
600 mg | ORAL_TABLET | Freq: Two times a day (BID) | ORAL | 0 refills | Status: AC
Start: 2017-09-01 — End: ?

## 2017-09-02 ENCOUNTER — Encounter: Admit: 2017-09-02 | Discharge: 2017-09-02 | Payer: BC Managed Care – PPO

## 2017-09-03 ENCOUNTER — Encounter: Admit: 2017-09-03 | Discharge: 2017-09-03 | Payer: BC Managed Care – PPO

## 2017-09-03 ENCOUNTER — Ambulatory Visit: Admit: 2017-09-03 | Discharge: 2017-09-04 | Payer: BC Managed Care – PPO

## 2017-09-03 DIAGNOSIS — T50905A Adverse effect of unspecified drugs, medicaments and biological substances, initial encounter: Principal | ICD-10-CM

## 2017-09-03 DIAGNOSIS — E079 Disorder of thyroid, unspecified: ICD-10-CM

## 2017-09-03 DIAGNOSIS — Z792 Long term (current) use of antibiotics: ICD-10-CM

## 2017-09-03 DIAGNOSIS — E785 Hyperlipidemia, unspecified: ICD-10-CM

## 2017-09-03 DIAGNOSIS — A499 Bacterial infection, unspecified: ICD-10-CM

## 2017-09-03 DIAGNOSIS — M86379 Chronic multifocal osteomyelitis, unspecified ankle and foot: ICD-10-CM

## 2017-09-03 DIAGNOSIS — M1990 Unspecified osteoarthritis, unspecified site: Principal | ICD-10-CM

## 2017-09-03 DIAGNOSIS — E119 Type 2 diabetes mellitus without complications: ICD-10-CM

## 2017-09-03 DIAGNOSIS — F99 Mental disorder, not otherwise specified: ICD-10-CM

## 2017-09-03 DIAGNOSIS — I10 Essential (primary) hypertension: ICD-10-CM

## 2017-09-03 DIAGNOSIS — K929 Disease of digestive system, unspecified: ICD-10-CM

## 2017-09-04 ENCOUNTER — Encounter: Admit: 2017-09-04 | Discharge: 2017-09-04 | Payer: BC Managed Care – PPO

## 2017-09-07 LAB — HEMOGLOBIN: Lab: 13 — AB (ref 65–100)

## 2017-09-07 LAB — VANCOMYCIN TROUGH: Lab: 15 (ref 0.2–1.3)

## 2017-09-07 LAB — CREATININE: Lab: 0.8 (ref 3.5–5)

## 2017-09-07 LAB — BUN: Lab: 14 (ref >=60)

## 2017-09-07 LAB — POTASSIUM: Lab: 3.9 (ref 0–40)

## 2017-09-07 LAB — SED RATE: Lab: 16

## 2017-09-07 LAB — PLATELET COUNT: Lab: 238 (ref 6.3–8.2)

## 2017-09-07 LAB — CBC: Lab: 6.1 (ref 8.5–10.6)

## 2017-09-07 LAB — AST (SGOT): Lab: 53 (ref 0–41)

## 2017-09-07 LAB — ALT (SGPT): Lab: 31 (ref >=60)

## 2017-09-07 LAB — ALK PHOS TOTAL: Lab: 66 (ref 40–130)

## 2017-09-09 ENCOUNTER — Encounter: Admit: 2017-09-09 | Discharge: 2017-09-09 | Payer: BC Managed Care – PPO

## 2017-09-11 ENCOUNTER — Encounter: Admit: 2017-09-11 | Discharge: 2017-09-11 | Payer: BC Managed Care – PPO

## 2017-09-11 DIAGNOSIS — B999 Unspecified infectious disease: Principal | ICD-10-CM

## 2017-09-14 ENCOUNTER — Encounter: Admit: 2017-09-14 | Discharge: 2017-09-14 | Payer: BC Managed Care – PPO

## 2017-09-14 LAB — CULTURE-FUNGAL,OTHER

## 2017-09-14 LAB — SED RATE: Lab: 18 {cells}/uL (ref 0–200)

## 2017-09-14 LAB — BUN: Lab: 14

## 2017-09-16 ENCOUNTER — Encounter: Admit: 2017-09-16 | Discharge: 2017-09-16 | Payer: BC Managed Care – PPO

## 2017-09-16 DIAGNOSIS — Z792 Long term (current) use of antibiotics: Principal | ICD-10-CM

## 2017-09-16 DIAGNOSIS — B999 Unspecified infectious disease: ICD-10-CM

## 2017-09-22 ENCOUNTER — Encounter: Admit: 2017-09-22 | Discharge: 2017-09-22 | Payer: BC Managed Care – PPO

## 2017-09-22 ENCOUNTER — Ambulatory Visit: Admit: 2017-09-22 | Discharge: 2017-09-22 | Payer: BC Managed Care – PPO

## 2017-09-22 DIAGNOSIS — I10 Essential (primary) hypertension: ICD-10-CM

## 2017-09-22 DIAGNOSIS — F99 Mental disorder, not otherwise specified: ICD-10-CM

## 2017-09-22 DIAGNOSIS — K929 Disease of digestive system, unspecified: ICD-10-CM

## 2017-09-22 DIAGNOSIS — M1990 Unspecified osteoarthritis, unspecified site: Principal | ICD-10-CM

## 2017-09-22 DIAGNOSIS — Z792 Long term (current) use of antibiotics: Principal | ICD-10-CM

## 2017-09-22 DIAGNOSIS — I890 Lymphedema, not elsewhere classified: Principal | ICD-10-CM

## 2017-09-22 DIAGNOSIS — M7989 Other specified soft tissue disorders: ICD-10-CM

## 2017-09-22 DIAGNOSIS — E079 Disorder of thyroid, unspecified: ICD-10-CM

## 2017-09-22 DIAGNOSIS — B999 Unspecified infectious disease: ICD-10-CM

## 2017-09-22 DIAGNOSIS — E785 Hyperlipidemia, unspecified: ICD-10-CM

## 2017-09-22 DIAGNOSIS — E119 Type 2 diabetes mellitus without complications: ICD-10-CM

## 2017-09-23 ENCOUNTER — Encounter: Admit: 2017-09-23 | Discharge: 2017-09-23 | Payer: BC Managed Care – PPO

## 2017-09-28 ENCOUNTER — Encounter: Admit: 2017-09-28 | Discharge: 2017-09-28 | Payer: BC Managed Care – PPO

## 2017-09-28 LAB — BUN: Lab: 16

## 2017-09-28 LAB — CREATININE: Lab: 0.8

## 2017-09-28 LAB — ALT (SGPT): Lab: 23

## 2017-09-28 LAB — POTASSIUM: Lab: 3.8

## 2017-09-28 LAB — PLATELET COUNT: Lab: 168

## 2017-09-28 LAB — HEMOGLOBIN: Lab: 13

## 2017-09-28 LAB — AST (SGOT): Lab: 29

## 2017-09-28 LAB — ALK PHOS TOTAL: Lab: 72

## 2017-09-28 LAB — CBC: Lab: 5.8

## 2017-10-06 ENCOUNTER — Encounter: Admit: 2017-10-06 | Discharge: 2017-10-06 | Payer: BC Managed Care – PPO

## 2017-10-27 LAB — AST (SGOT): Lab: 29

## 2017-10-27 LAB — ALT (SGPT): Lab: 21

## 2017-10-27 LAB — POTASSIUM: Lab: 3.9

## 2017-10-27 LAB — CREATININE: Lab: 0.8

## 2017-10-27 LAB — SED RATE: Lab: 16

## 2017-10-27 LAB — ALK PHOS TOTAL: Lab: 77

## 2017-10-27 LAB — BUN: Lab: 18

## 2017-10-28 ENCOUNTER — Encounter: Admit: 2017-10-28 | Discharge: 2017-10-28 | Payer: BC Managed Care – PPO

## 2017-11-24 LAB — CREATININE: Lab: 0.7

## 2017-11-24 LAB — POTASSIUM: Lab: 4

## 2017-11-24 LAB — BUN: Lab: 21

## 2017-11-24 LAB — AST (SGOT): Lab: 23

## 2017-11-24 LAB — SED RATE: Lab: 15

## 2017-11-24 LAB — ALK PHOS TOTAL: Lab: 102

## 2017-11-24 LAB — ALT (SGPT): Lab: 21

## 2017-11-25 ENCOUNTER — Encounter: Admit: 2017-11-25 | Discharge: 2017-11-25 | Payer: BC Managed Care – PPO

## 2017-11-27 ENCOUNTER — Encounter: Admit: 2017-11-27 | Discharge: 2017-11-27 | Payer: BC Managed Care – PPO

## 2018-01-11 ENCOUNTER — Ambulatory Visit: Admit: 2018-01-11 | Discharge: 2018-01-11 | Payer: BC Managed Care – PPO

## 2018-01-11 ENCOUNTER — Encounter: Admit: 2018-01-11 | Discharge: 2018-01-11 | Payer: BC Managed Care – PPO

## 2018-01-11 DIAGNOSIS — K929 Disease of digestive system, unspecified: ICD-10-CM

## 2018-01-11 DIAGNOSIS — M8660 Other chronic osteomyelitis, unspecified site: Principal | ICD-10-CM

## 2018-01-11 DIAGNOSIS — E785 Hyperlipidemia, unspecified: ICD-10-CM

## 2018-01-11 DIAGNOSIS — M1990 Unspecified osteoarthritis, unspecified site: ICD-10-CM

## 2018-01-11 DIAGNOSIS — E119 Type 2 diabetes mellitus without complications: ICD-10-CM

## 2018-01-11 DIAGNOSIS — F99 Mental disorder, not otherwise specified: ICD-10-CM

## 2018-01-11 DIAGNOSIS — E079 Disorder of thyroid, unspecified: ICD-10-CM

## 2018-01-11 DIAGNOSIS — I10 Essential (primary) hypertension: ICD-10-CM

## 2018-01-11 LAB — COMPREHENSIVE METABOLIC PANEL
Lab: 0.3 mg/dL (ref 0.3–1.2)
Lab: 0.7 mg/dL (ref 0.4–1.00)
Lab: 106 MMOL/L (ref 98–110)
Lab: 121 mg/dL — ABNORMAL HIGH (ref 70–100)
Lab: 139 MMOL/L (ref 137–147)
Lab: 16 mg/dL (ref 7–25)
Lab: 18 U/L (ref 7–40)
Lab: 3.8 MMOL/L (ref 3.5–5.1)
Lab: 3.8 g/dL (ref 3.5–5.0)
Lab: 7 g/dL (ref 6.0–8.0)
Lab: 79 U/L (ref 25–110)
Lab: 9.5 mg/dL (ref 8.5–10.6)

## 2018-01-11 LAB — VANCOMYCIN TIMED LEVEL: Lab: 4.8 ug/mL

## 2019-11-07 IMAGING — MG MAMMO SCREEN BILAT W OR WO CAD
5 series · 5 of 5 positions shown · non-contrast
Comparison: March 17, 2016

Bilateral screening mammogram.  CAD analysis utilized .
HISTORY: Screening.

[L CC]
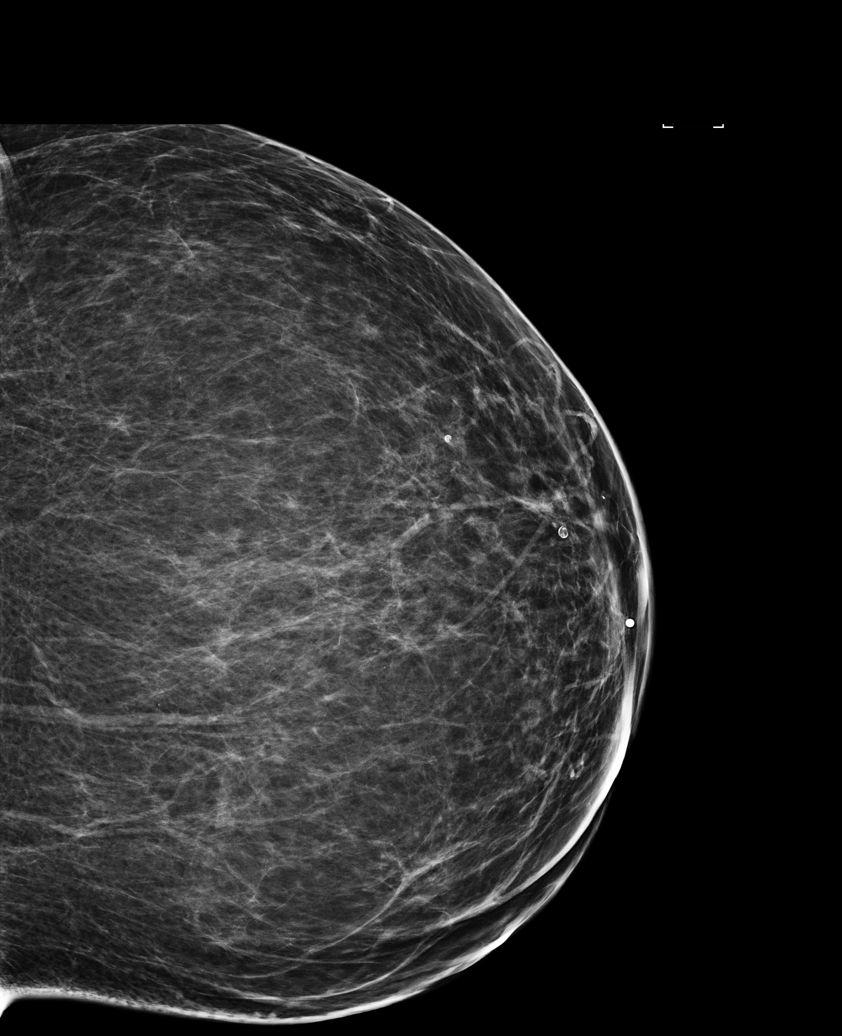

[L MLO (1 of 2)]
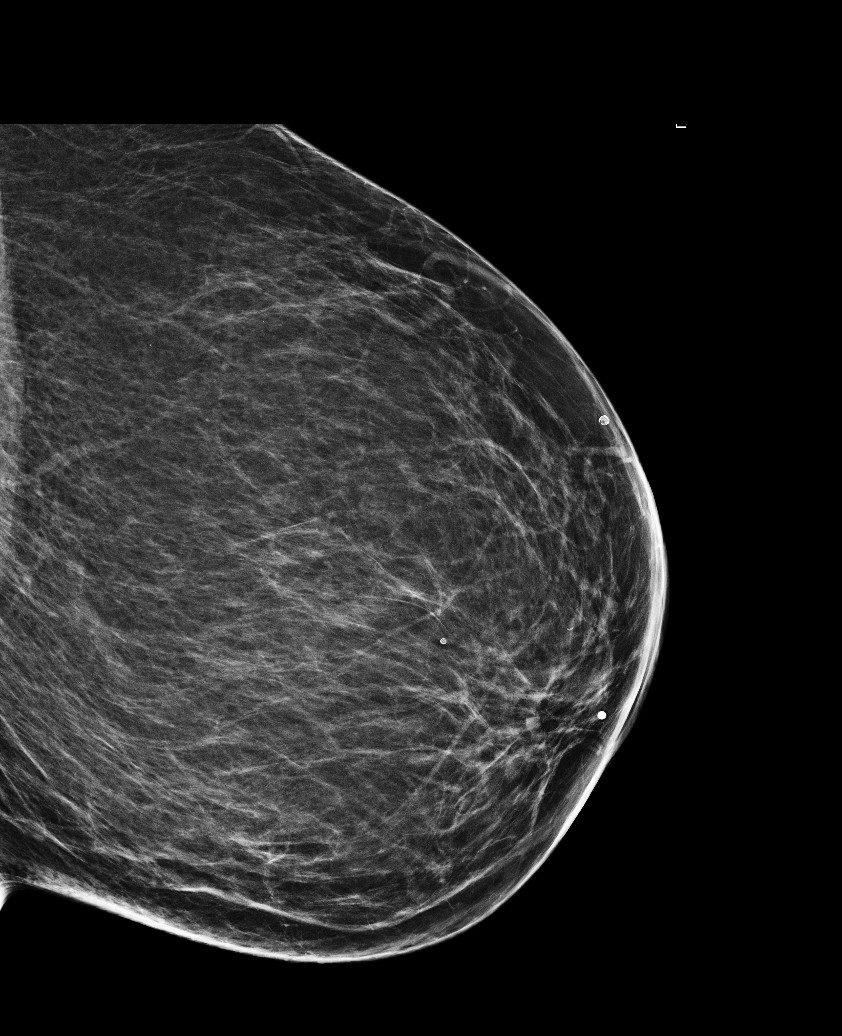

[L MLO (2 of 2)]
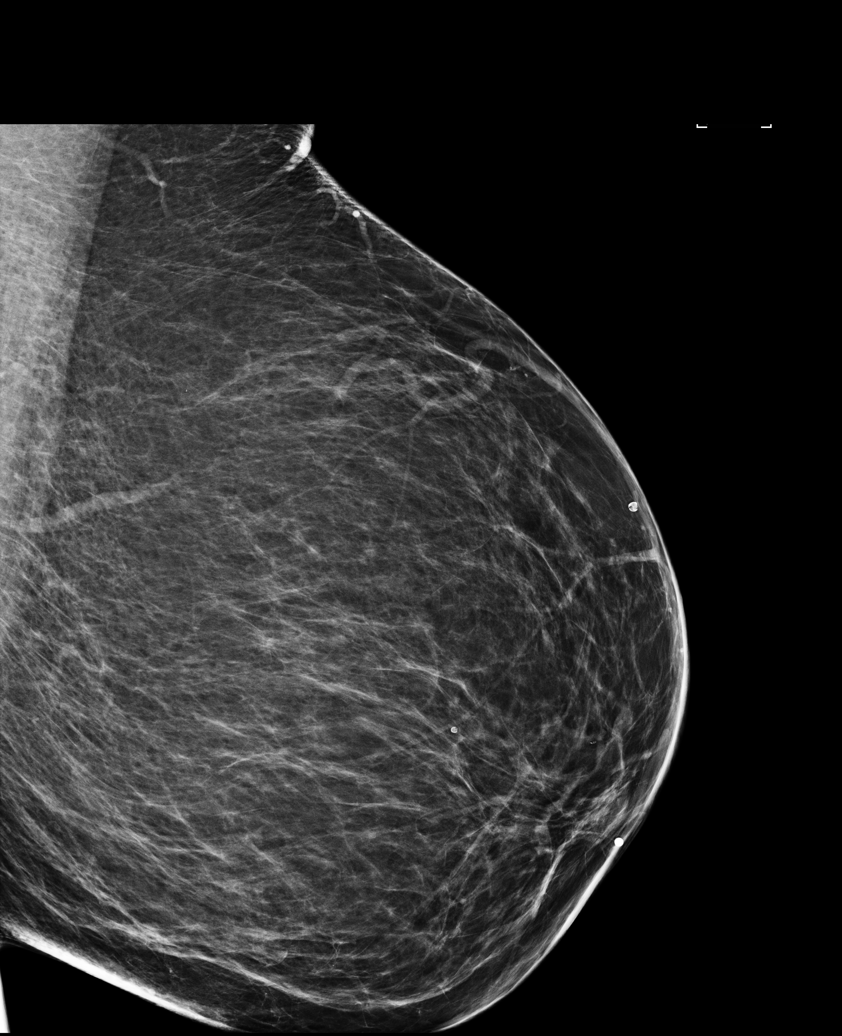

[R MLO]
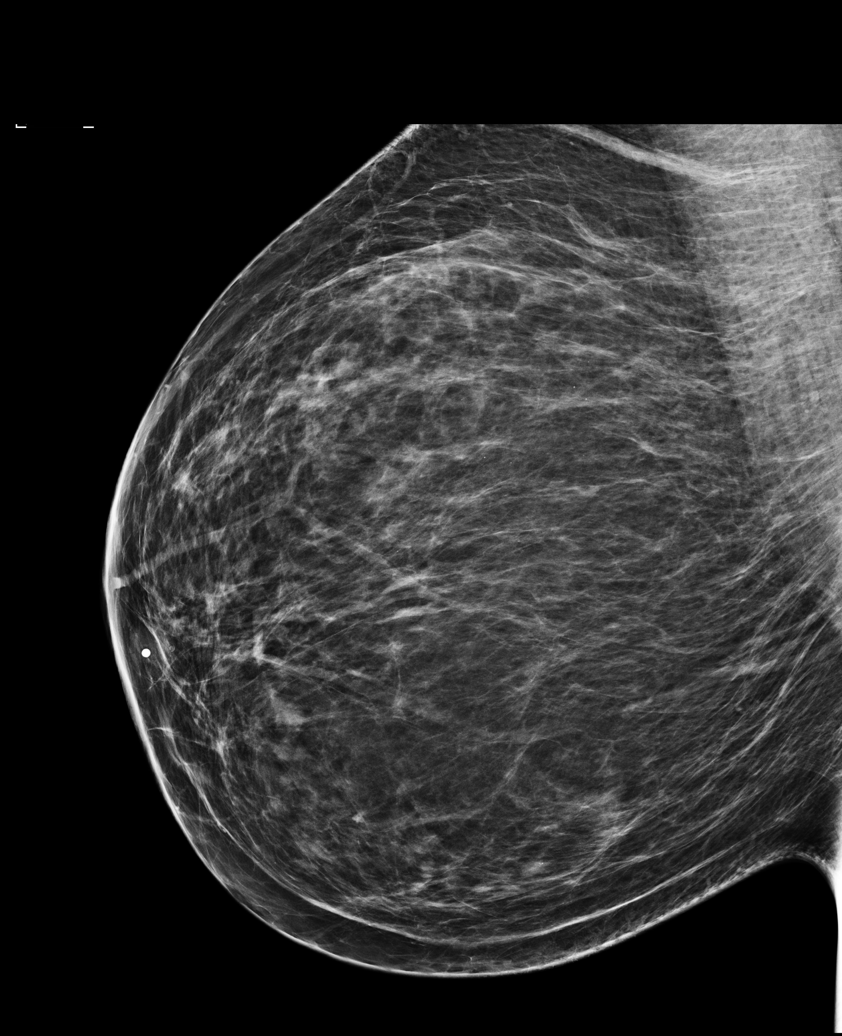

[R CC]
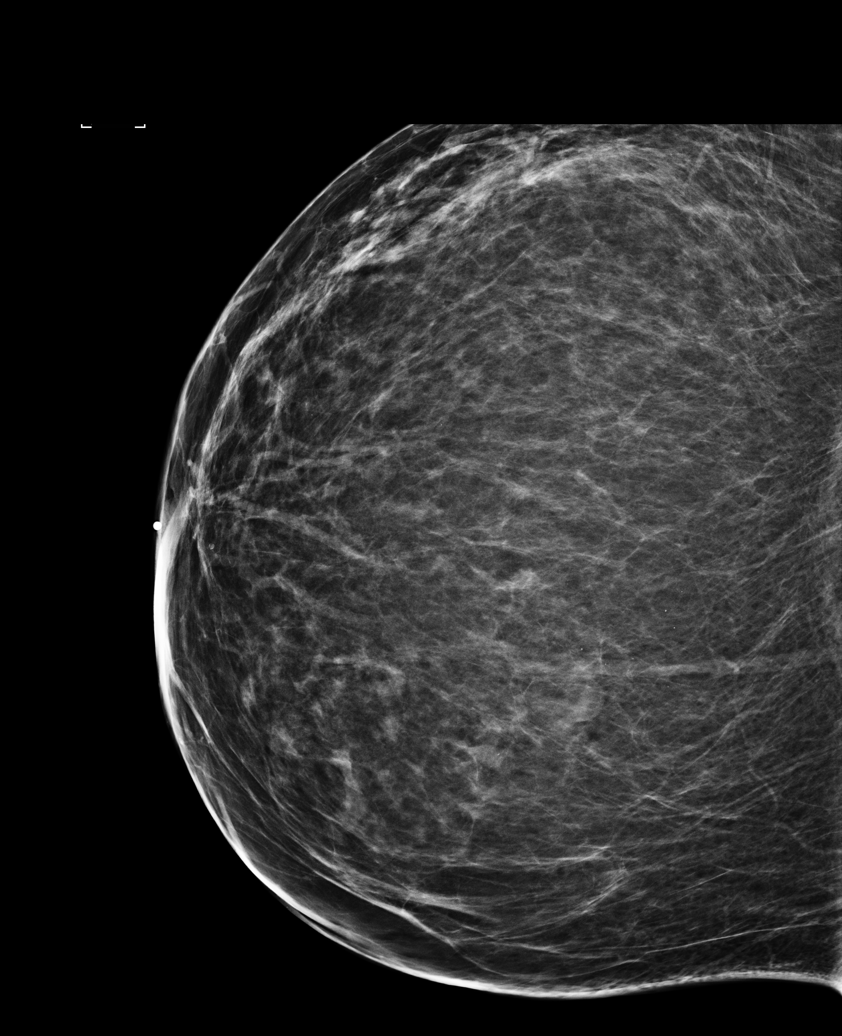

[5 of 5 positions shown; findings below may reference images not displayed]

FINDINGS: The breast tissue is composed of scattered fibroglandular 
densities. No suspicious masses, clustered microcalcifications, or
suspicious architectural distortion. Scattered bilateral
benign-appearing calcifications are present.
IMPRESSION
IMPRESSION: No mammographic evidence of malignancy. In the absence of 
clinical findings, a screening mammogram should be obtained in one
year. 
BI-RADS 2: Benign findings. 
Breast density: B

## 2022-04-16 ENCOUNTER — Encounter: Admit: 2022-04-16 | Discharge: 2022-04-16 | Payer: BC Managed Care – PPO

## 2023-12-30 ENCOUNTER — Encounter: Admit: 2023-12-30 | Discharge: 2023-12-30 | Payer: BLUE CROSS/BLUE SHIELD

## 2024-01-07 ENCOUNTER — Encounter: Admit: 2024-01-07 | Discharge: 2024-01-07 | Payer: BLUE CROSS/BLUE SHIELD

## 2024-02-09 ENCOUNTER — Encounter: Admit: 2024-02-09 | Discharge: 2024-02-09 | Payer: BLUE CROSS/BLUE SHIELD

## 2024-02-29 ENCOUNTER — Encounter: Admit: 2024-02-29 | Discharge: 2024-02-29 | Payer: BLUE CROSS/BLUE SHIELD
